# Patient Record
Sex: Female | Born: 1975 | Race: White | Hispanic: No | Marital: Married
Health system: Southern US, Community
[De-identification: ages and names within clinical notes are randomized; demographics above are authoritative.]

---

## 1998-09-17 ENCOUNTER — Inpatient Hospital Stay (HOSPITAL_COMMUNITY): Admission: AD | Admit: 1998-09-17 | Discharge: 1998-09-17 | Payer: Self-pay | Admitting: Obstetrics and Gynecology

## 1998-09-21 ENCOUNTER — Inpatient Hospital Stay (HOSPITAL_COMMUNITY): Admission: AD | Admit: 1998-09-21 | Discharge: 1998-09-23 | Payer: Self-pay | Admitting: Obstetrics and Gynecology

## 1998-09-28 ENCOUNTER — Encounter (HOSPITAL_COMMUNITY): Admission: RE | Admit: 1998-09-28 | Discharge: 1998-12-27 | Payer: Self-pay | Admitting: Obstetrics and Gynecology

## 1998-11-01 ENCOUNTER — Other Ambulatory Visit: Admission: RE | Admit: 1998-11-01 | Discharge: 1998-11-01 | Payer: Self-pay | Admitting: Obstetrics and Gynecology

## 2000-02-03 ENCOUNTER — Inpatient Hospital Stay (HOSPITAL_COMMUNITY): Admission: AD | Admit: 2000-02-03 | Discharge: 2000-02-03 | Payer: Self-pay | Admitting: Obstetrics and Gynecology

## 2000-02-13 ENCOUNTER — Ambulatory Visit (HOSPITAL_COMMUNITY): Admission: RE | Admit: 2000-02-13 | Discharge: 2000-02-13 | Payer: Self-pay | Admitting: Obstetrics and Gynecology

## 2000-02-13 ENCOUNTER — Encounter: Payer: Self-pay | Admitting: Obstetrics and Gynecology

## 2000-02-19 ENCOUNTER — Inpatient Hospital Stay (HOSPITAL_COMMUNITY): Admission: AD | Admit: 2000-02-19 | Discharge: 2000-02-21 | Payer: Self-pay | Admitting: *Deleted

## 2000-04-05 ENCOUNTER — Other Ambulatory Visit: Admission: RE | Admit: 2000-04-05 | Discharge: 2000-04-05 | Payer: Self-pay | Admitting: Obstetrics and Gynecology

## 2000-10-31 ENCOUNTER — Other Ambulatory Visit: Admission: RE | Admit: 2000-10-31 | Discharge: 2000-10-31 | Payer: Self-pay | Admitting: Obstetrics and Gynecology

## 2003-11-07 ENCOUNTER — Ambulatory Visit: Payer: Self-pay | Admitting: Internal Medicine

## 2004-01-07 ENCOUNTER — Ambulatory Visit: Payer: Self-pay | Admitting: Internal Medicine

## 2004-04-28 ENCOUNTER — Ambulatory Visit: Payer: Self-pay | Admitting: Gastroenterology

## 2008-10-26 ENCOUNTER — Ambulatory Visit: Payer: Self-pay | Admitting: Obstetrics and Gynecology

## 2008-11-05 ENCOUNTER — Ambulatory Visit: Payer: Self-pay | Admitting: Obstetrics and Gynecology

## 2008-12-13 ENCOUNTER — Emergency Department: Payer: Self-pay | Admitting: Emergency Medicine

## 2013-04-08 ENCOUNTER — Ambulatory Visit: Payer: Self-pay | Admitting: General Practice

## 2013-06-20 DIAGNOSIS — D239 Other benign neoplasm of skin, unspecified: Secondary | ICD-10-CM

## 2013-06-20 HISTORY — DX: Other benign neoplasm of skin, unspecified: D23.9

## 2013-12-22 ENCOUNTER — Ambulatory Visit: Payer: Self-pay | Admitting: Family Medicine

## 2014-01-06 ENCOUNTER — Ambulatory Visit: Payer: Self-pay | Admitting: Family Medicine

## 2014-11-26 DIAGNOSIS — R634 Abnormal weight loss: Secondary | ICD-10-CM | POA: Insufficient documentation

## 2015-09-14 ENCOUNTER — Other Ambulatory Visit: Payer: Self-pay | Admitting: Family Medicine

## 2015-09-14 DIAGNOSIS — Z1231 Encounter for screening mammogram for malignant neoplasm of breast: Secondary | ICD-10-CM

## 2015-09-27 ENCOUNTER — Other Ambulatory Visit: Payer: Self-pay | Admitting: Family Medicine

## 2015-09-27 ENCOUNTER — Ambulatory Visit
Admission: RE | Admit: 2015-09-27 | Discharge: 2015-09-27 | Disposition: A | Discharge status: Home / Self Care | Payer: 59 | Source: Ambulatory Visit | Attending: Family Medicine | Admitting: Family Medicine

## 2015-09-27 DIAGNOSIS — Z1231 Encounter for screening mammogram for malignant neoplasm of breast: Secondary | ICD-10-CM | POA: Diagnosis present

## 2016-09-18 ENCOUNTER — Other Ambulatory Visit: Payer: Self-pay | Admitting: Family Medicine

## 2016-09-18 DIAGNOSIS — Z1231 Encounter for screening mammogram for malignant neoplasm of breast: Secondary | ICD-10-CM

## 2016-09-29 ENCOUNTER — Ambulatory Visit
Admission: RE | Admit: 2016-09-29 | Discharge: 2016-09-29 | Disposition: A | Discharge status: Home / Self Care | Payer: 59 | Source: Ambulatory Visit | Attending: Family Medicine | Admitting: Family Medicine

## 2016-09-29 DIAGNOSIS — Z1231 Encounter for screening mammogram for malignant neoplasm of breast: Secondary | ICD-10-CM | POA: Diagnosis present

## 2016-10-31 ENCOUNTER — Other Ambulatory Visit: Payer: Self-pay | Admitting: Family Medicine

## 2016-10-31 DIAGNOSIS — N6320 Unspecified lump in the left breast, unspecified quadrant: Secondary | ICD-10-CM

## 2016-11-01 ENCOUNTER — Other Ambulatory Visit: Payer: Self-pay | Admitting: Family Medicine

## 2016-11-01 DIAGNOSIS — N6320 Unspecified lump in the left breast, unspecified quadrant: Secondary | ICD-10-CM

## 2016-11-15 ENCOUNTER — Ambulatory Visit
Admission: RE | Admit: 2016-11-15 | Discharge: 2016-11-15 | Disposition: A | Discharge status: Home / Self Care | Payer: 59 | Source: Ambulatory Visit | Attending: Family Medicine | Admitting: Family Medicine

## 2016-11-15 DIAGNOSIS — N6320 Unspecified lump in the left breast, unspecified quadrant: Secondary | ICD-10-CM | POA: Diagnosis present

## 2016-11-15 DIAGNOSIS — N6002 Solitary cyst of left breast: Secondary | ICD-10-CM | POA: Diagnosis not present

## 2017-10-03 ENCOUNTER — Other Ambulatory Visit: Payer: Self-pay | Admitting: Family Medicine

## 2017-10-03 DIAGNOSIS — Z1231 Encounter for screening mammogram for malignant neoplasm of breast: Secondary | ICD-10-CM

## 2017-10-14 IMAGING — US US BREAST*L* LIMITED INC AXILLA
1 series · 6 of 6 positions shown · non-contrast
Comparison: Previous exam(s).

CLINICAL DATA: Patient complains a palpable mass in the left
breast.

EXAM:
2D DIGITAL DIAGNOSTIC LEFT MAMMOGRAM WITH CAD AND ADJUNCT TOMO
ULTRASOUND LEFT BREAST

[Series 1: us breast*left* limited inc axilla · 0.02mm/px · 6 of 6 slices shown]
[im 1/6]
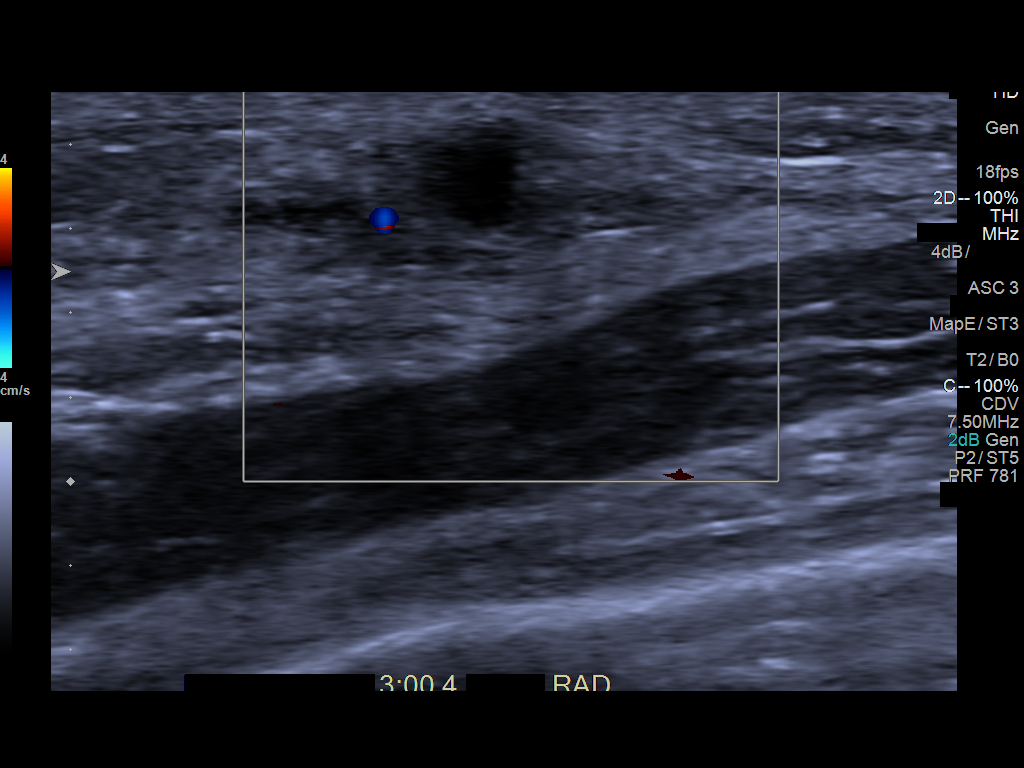
[im 2/6]
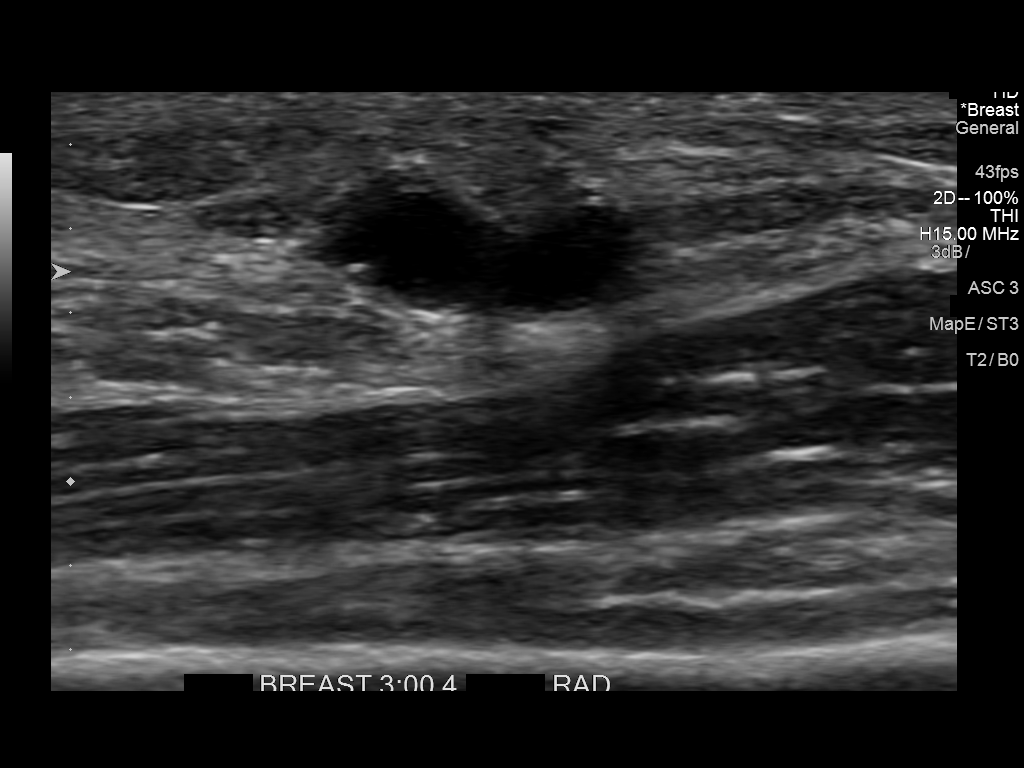
[im 3/6]
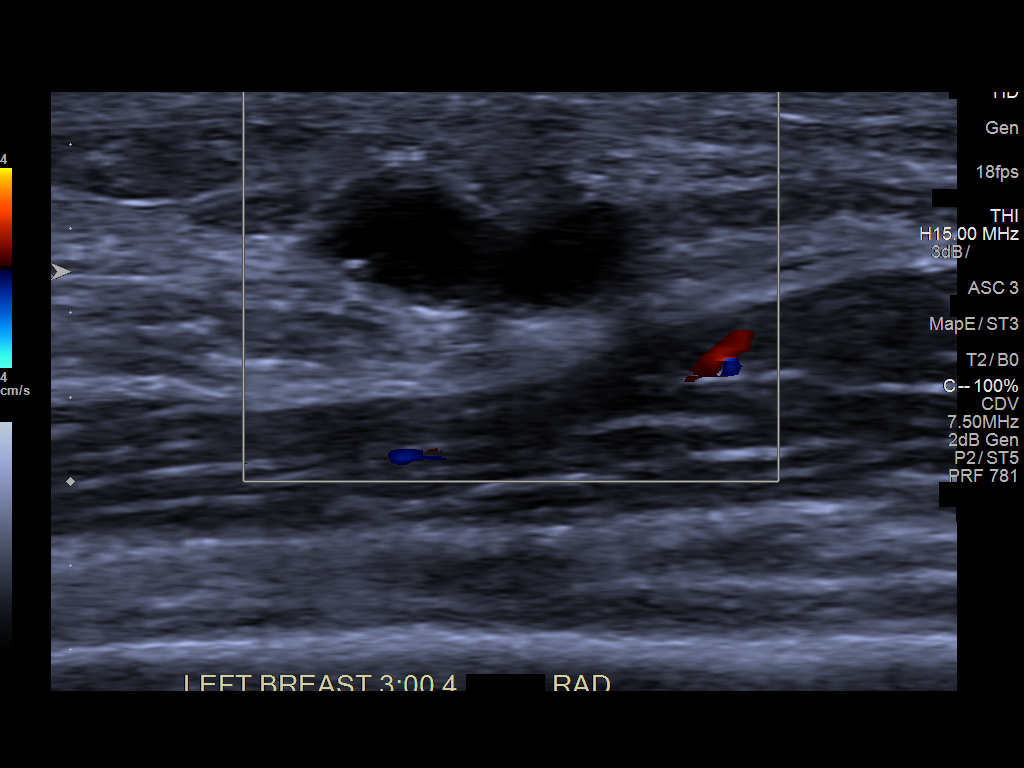
[im 4/6]
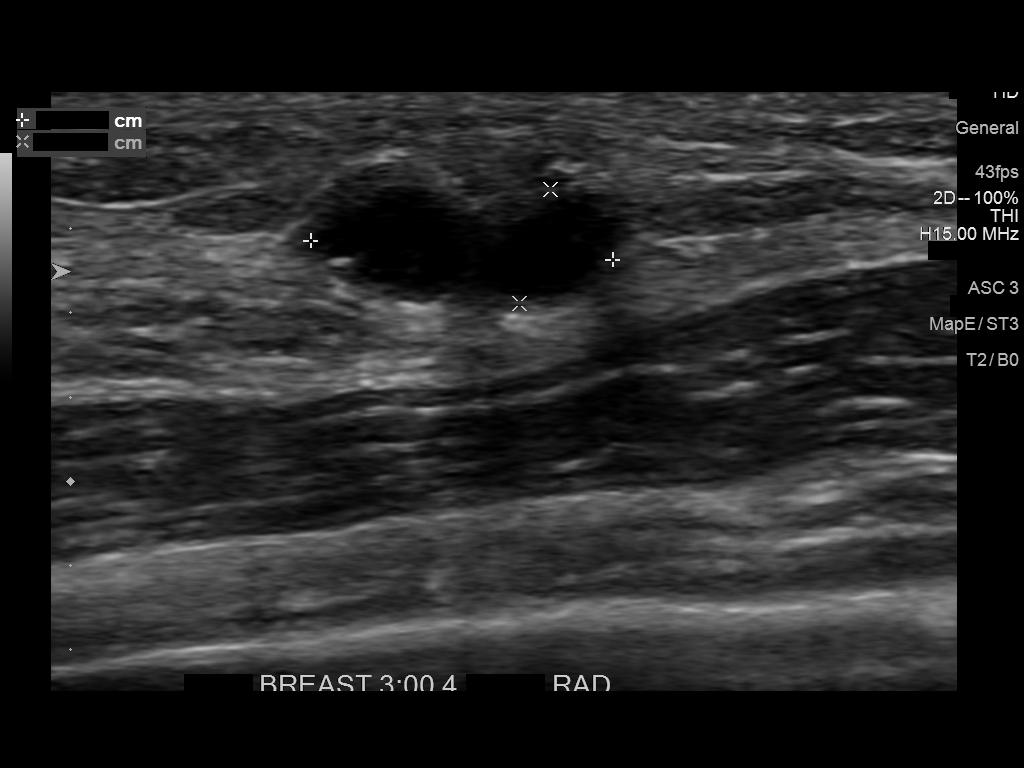
[im 5/6]
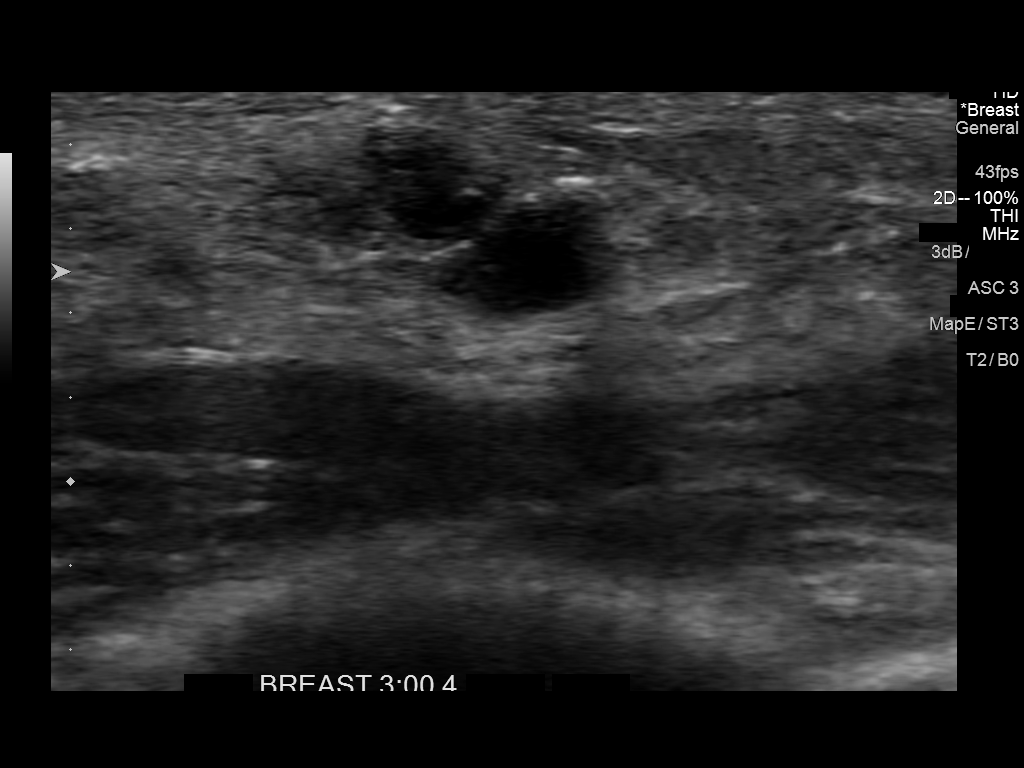
[im 6/6]
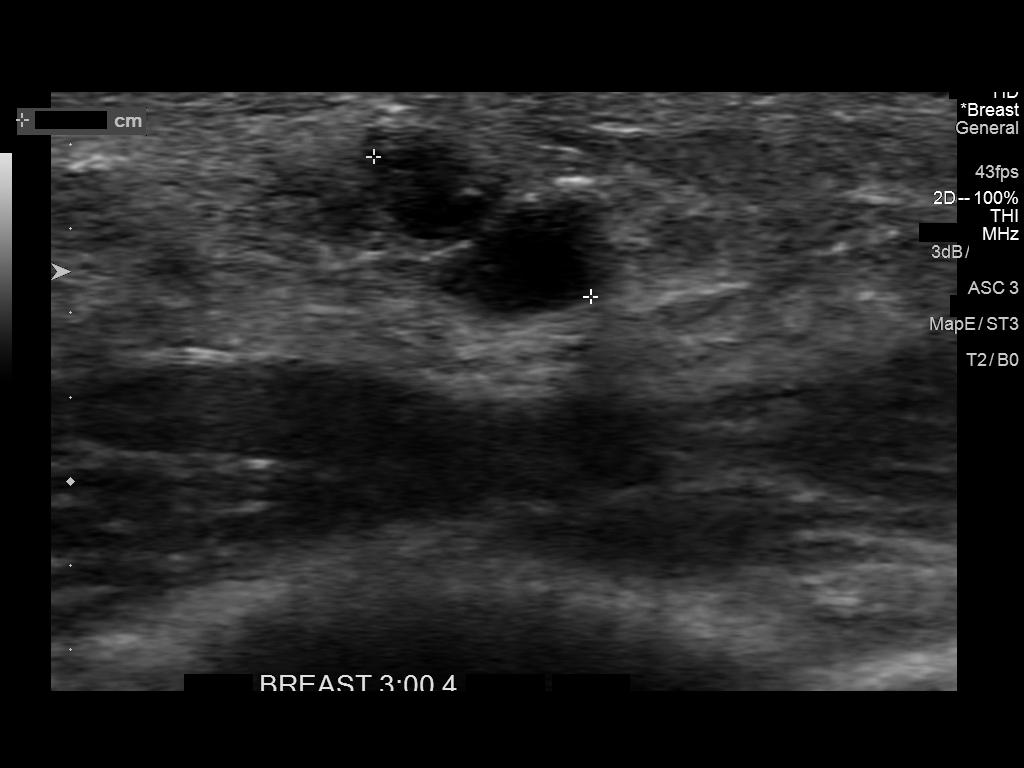

[6 of 6 positions shown; findings below may reference images not displayed]

ACR Breast Density Category c: The breast tissue is heterogeneously
dense, which may obscure small masses.
FINDINGS: Radiopaque BB marks the palpable abnormality in the 3 o'clock region
of the left breast. On the spot tangential view there is an obscured
9 mm mass. No additional masses are seen in the left breast. There
are no malignant type microcalcifications.

Mammographic images were processed with CAD.

On physical exam, I palpate a discrete mass in the left breast at 3
o'clock 4 cm from the nipple.

Targeted ultrasound is performed, showing an anechoic bilobed cyst
in the left breast at 3 o'clock 4 cm from the nipple measuring 7 x 3
x 6 mm. No solid mass or abnormal shadowing detected.
IMPRESSION: Left breast cyst.  No evidence of malignancy.

RECOMMENDATION:
Bilateral screening mammogram in Wednesday September, 2017 is recommended.

I have discussed the findings and recommendations with the patient.
Results were also provided in writing at the conclusion of the
visit. If applicable, a reminder letter will be sent to the patient
regarding the next appointment.

BI-RADS CATEGORY  2: Benign.

## 2017-10-26 ENCOUNTER — Ambulatory Visit
Admission: RE | Admit: 2017-10-26 | Discharge: 2017-10-26 | Disposition: A | Discharge status: Home / Self Care | Payer: Managed Care, Other (non HMO) | Source: Ambulatory Visit | Attending: Family Medicine | Admitting: Family Medicine

## 2017-10-26 DIAGNOSIS — Z1231 Encounter for screening mammogram for malignant neoplasm of breast: Secondary | ICD-10-CM | POA: Diagnosis present

## 2017-10-30 ENCOUNTER — Other Ambulatory Visit: Payer: Self-pay | Admitting: Family Medicine

## 2017-10-30 DIAGNOSIS — N6489 Other specified disorders of breast: Secondary | ICD-10-CM

## 2017-10-30 DIAGNOSIS — N6320 Unspecified lump in the left breast, unspecified quadrant: Secondary | ICD-10-CM

## 2017-10-30 DIAGNOSIS — R928 Other abnormal and inconclusive findings on diagnostic imaging of breast: Secondary | ICD-10-CM

## 2017-11-12 ENCOUNTER — Ambulatory Visit
Admission: RE | Admit: 2017-11-12 | Discharge: 2017-11-12 | Disposition: A | Discharge status: Home / Self Care | Payer: Managed Care, Other (non HMO) | Source: Ambulatory Visit | Attending: Family Medicine | Admitting: Family Medicine

## 2017-11-12 DIAGNOSIS — N6320 Unspecified lump in the left breast, unspecified quadrant: Secondary | ICD-10-CM

## 2017-11-12 DIAGNOSIS — N6489 Other specified disorders of breast: Secondary | ICD-10-CM | POA: Insufficient documentation

## 2017-11-12 DIAGNOSIS — R928 Other abnormal and inconclusive findings on diagnostic imaging of breast: Secondary | ICD-10-CM

## 2020-12-20 ENCOUNTER — Other Ambulatory Visit: Payer: Self-pay | Admitting: Family Medicine

## 2020-12-20 DIAGNOSIS — Z1231 Encounter for screening mammogram for malignant neoplasm of breast: Secondary | ICD-10-CM

## 2021-03-07 ENCOUNTER — Ambulatory Visit
Admission: RE | Admit: 2021-03-07 | Discharge: 2021-03-07 | Disposition: A | Discharge status: Home / Self Care | Payer: Managed Care, Other (non HMO) | Source: Ambulatory Visit | Attending: Family Medicine | Admitting: Family Medicine

## 2021-03-07 ENCOUNTER — Other Ambulatory Visit: Payer: Self-pay

## 2021-03-07 DIAGNOSIS — Z1231 Encounter for screening mammogram for malignant neoplasm of breast: Secondary | ICD-10-CM | POA: Insufficient documentation

## 2021-04-11 ENCOUNTER — Other Ambulatory Visit: Payer: Self-pay

## 2021-04-11 ENCOUNTER — Ambulatory Visit (INDEPENDENT_AMBULATORY_CARE_PROVIDER_SITE_OTHER): Payer: Managed Care, Other (non HMO) | Admitting: Dermatology

## 2021-04-11 ENCOUNTER — Encounter: Payer: Self-pay | Admitting: Dermatology

## 2021-04-11 DIAGNOSIS — D485 Neoplasm of uncertain behavior of skin: Secondary | ICD-10-CM

## 2021-04-11 DIAGNOSIS — Z86018 Personal history of other benign neoplasm: Secondary | ICD-10-CM

## 2021-04-11 DIAGNOSIS — L814 Other melanin hyperpigmentation: Secondary | ICD-10-CM

## 2021-04-11 DIAGNOSIS — Z1283 Encounter for screening for malignant neoplasm of skin: Secondary | ICD-10-CM

## 2021-04-11 DIAGNOSIS — D225 Melanocytic nevi of trunk: Secondary | ICD-10-CM

## 2021-04-11 DIAGNOSIS — D229 Melanocytic nevi, unspecified: Secondary | ICD-10-CM

## 2021-04-11 DIAGNOSIS — L821 Other seborrheic keratosis: Secondary | ICD-10-CM

## 2021-04-11 DIAGNOSIS — D1800 Hemangioma unspecified site: Secondary | ICD-10-CM

## 2021-04-11 DIAGNOSIS — D2271 Melanocytic nevi of right lower limb, including hip: Secondary | ICD-10-CM

## 2021-04-11 DIAGNOSIS — L578 Other skin changes due to chronic exposure to nonionizing radiation: Secondary | ICD-10-CM

## 2021-04-11 NOTE — Progress Notes (Signed)
? ?New Patient Visit ? ?Subjective  ?Lynn Krueger is a 46 y.o. female who presents for the following: Annual Exam (History of dysplastic nevus - TBSE today). ?The patient presents for Total-Body Skin Exam (TBSE) for skin cancer screening and mole check.  The patient has spots, moles and lesions to be evaluated, some may be new or changing and the patient has concerns that these could be cancer. ? ?The following portions of the chart were reviewed this encounter and updated as appropriate:  ? Allergies  Meds  Problems  Med Hx  Surg Hx  Fam Hx   ?  ?Review of Systems:  No other skin or systemic complaints except as noted in HPI or Assessment and Plan. ? ?Objective  ?Well appearing patient in no apparent distress; mood and affect are within normal limits. ? ?A full examination was performed including scalp, head, eyes, ears, nose, lips, neck, chest, axillae, abdomen, back, buttocks, bilateral upper extremities, bilateral lower extremities, hands, feet, fingers, toes, fingernails, and toenails. All findings within normal limits unless otherwise noted below. ? ?Right medial calf ?Well healed biopsy site ? ?Right UQA lat at the side ?0.5 x 0.7 cm brown papule ? ? ? ? ? ? ? ?Assessment & Plan  ? ?Lentigines ?- Scattered tan macules ?- Due to sun exposure ?- Benign-appearing, observe ?- Recommend daily broad spectrum sunscreen SPF 30+ to sun-exposed areas, reapply every 2 hours as needed. ?- Call for any changes ? ?Seborrheic Keratoses ?- Stuck-on, waxy, tan-brown papules and/or plaques  ?- Benign-appearing ?- Discussed benign etiology and prognosis. ?- Observe ?- Call for any changes ? ?Melanocytic Nevi ?- Tan-brown and/or pink-flesh-colored symmetric macules and papules ?- Benign appearing on exam today ?- Observation ?- Call clinic for new or changing moles ?- Recommend daily use of broad spectrum spf 30+ sunscreen to sun-exposed areas.  ? ?Hemangiomas ?- Red papules ?- Discussed benign nature ?- Observe ?-  Call for any changes ? ?Actinic Damage ?- Chronic condition, secondary to cumulative UV/sun exposure ?- diffuse scaly erythematous macules with underlying dyspigmentation ?- Recommend daily broad spectrum sunscreen SPF 30+ to sun-exposed areas, reapply every 2 hours as needed.  ?- Staying in the shade or wearing long sleeves, sun glasses (UVA+UVB protection) and wide brim hats (4-inch brim around the entire circumference of the hat) are also recommended for sun protection.  ?- Call for new or changing lesions. ? ?Skin cancer screening performed today. ? ?History of Dysplastic Nevi ?- No evidence of recurrence today ?- Recommend regular full body skin exams ?- Recommend daily broad spectrum sunscreen SPF 30+ to sun-exposed areas, reapply every 2 hours as needed.  ?- Call if any new or changing lesions are noted between office visits ? 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 ?Neoplasm of uncertain behavior of skin ?Right UQA lat at the side ?Epidermal / dermal shaving ? ?Lesion diameter (cm):  0.5 x 0.7 cm ?Informed consent: discussed and consent obtained   ?Timeout: patient name, date of birth, surgical site, and procedure verified   ?Procedure prep:  Patient was prepped and draped in usual sterile fashion ?Prep type:  Isopropyl alcohol ?Anesthesia: the lesion was anesthetized in a standard fashion   ?Anesthetic:  1% lidocaine w/ epinephrine 1-100,000 buffered w/ 8.4% NaHCO3 ?Instrument used: flexible razor blade   ?Hemostasis achieved with: pressure, aluminum chloride and electrodesiccation   ?Outcome: patient tolerated procedure well   ?Post-procedure details:  sterile dressing applied and wound care instructions given   ?Dressing type: bandage and petrolatum   ? ?Related Procedures ?Anatomic Pathology Report ? ?Skin cancer screening ? ?Return in about 1 year (around 04/12/2022) for TBSE. ? ?I, Ashok Cordia, CMA, am acting as scribe for  Sarina Ser, MD . ?Documentation: I have reviewed the above documentation for accuracy and completeness, and I agree with the above. ? ?Sarina Ser, MD ? ?

## 2021-04-11 NOTE — Patient Instructions (Signed)

## 2021-04-12 ENCOUNTER — Encounter: Payer: Self-pay | Admitting: Dermatology

## 2021-04-14 ENCOUNTER — Telehealth: Payer: Self-pay

## 2021-04-14 LAB — ANATOMIC PATHOLOGY REPORT

## 2021-04-14 NOTE — Telephone Encounter (Signed)
-----   Message from Ralene Bathe, MD sent at 04/14/2021 12:43 PM EST ----- ?Diagnosis synopsis: Comment  ?Comment: Specimen 1-Skin Biopsy, Right Upper Quadrant Lateral At The  ?Side: COMPOUND MELANOCYTIC NEVUS ? ?Benign mole ?No further treatment needed ?

## 2021-04-14 NOTE — Telephone Encounter (Signed)
Unable to leave a message. No V/M set up.  ?

## 2021-04-21 ENCOUNTER — Telehealth: Payer: Self-pay

## 2021-04-21 NOTE — Telephone Encounter (Signed)
Patient informed of pathology results via [facility].  ?

## 2021-04-21 NOTE — Telephone Encounter (Signed)
-----   Message from Ralene Bathe, MD sent at 04/14/2021 12:43 PM EST ----- ?Diagnosis synopsis: Comment  ?Comment: Specimen 1-Skin Biopsy, Right Upper Quadrant Lateral At The  ?Side: COMPOUND MELANOCYTIC NEVUS ? ?Benign mole ?No further treatment needed ?

## 2022-02-10 ENCOUNTER — Other Ambulatory Visit: Payer: Self-pay | Admitting: Internal Medicine

## 2022-02-10 DIAGNOSIS — Z1231 Encounter for screening mammogram for malignant neoplasm of breast: Secondary | ICD-10-CM

## 2022-03-13 ENCOUNTER — Ambulatory Visit
Admission: RE | Admit: 2022-03-13 | Discharge: 2022-03-13 | Disposition: A | Discharge status: Home / Self Care | Payer: Managed Care, Other (non HMO) | Source: Ambulatory Visit | Attending: Internal Medicine | Admitting: Internal Medicine

## 2022-03-13 DIAGNOSIS — Z1231 Encounter for screening mammogram for malignant neoplasm of breast: Secondary | ICD-10-CM | POA: Diagnosis present

## 2022-03-16 ENCOUNTER — Other Ambulatory Visit: Payer: Self-pay | Admitting: Internal Medicine

## 2022-03-16 DIAGNOSIS — R928 Other abnormal and inconclusive findings on diagnostic imaging of breast: Secondary | ICD-10-CM

## 2022-03-16 DIAGNOSIS — N63 Unspecified lump in unspecified breast: Secondary | ICD-10-CM

## 2022-03-21 ENCOUNTER — Ambulatory Visit
Admission: RE | Admit: 2022-03-21 | Discharge: 2022-03-21 | Disposition: A | Discharge status: Home / Self Care | Payer: Managed Care, Other (non HMO) | Source: Ambulatory Visit | Attending: Internal Medicine | Admitting: Internal Medicine

## 2022-03-21 DIAGNOSIS — R928 Other abnormal and inconclusive findings on diagnostic imaging of breast: Secondary | ICD-10-CM | POA: Diagnosis present

## 2022-03-21 DIAGNOSIS — N63 Unspecified lump in unspecified breast: Secondary | ICD-10-CM

## 2022-04-17 ENCOUNTER — Encounter: Payer: Self-pay | Admitting: Dermatology

## 2022-04-17 ENCOUNTER — Ambulatory Visit: Payer: Managed Care, Other (non HMO) | Admitting: Dermatology

## 2022-04-17 VITALS — BP 92/61

## 2022-04-17 DIAGNOSIS — D225 Melanocytic nevi of trunk: Secondary | ICD-10-CM

## 2022-04-17 DIAGNOSIS — L821 Other seborrheic keratosis: Secondary | ICD-10-CM

## 2022-04-17 DIAGNOSIS — Z1283 Encounter for screening for malignant neoplasm of skin: Secondary | ICD-10-CM

## 2022-04-17 DIAGNOSIS — L814 Other melanin hyperpigmentation: Secondary | ICD-10-CM

## 2022-04-17 DIAGNOSIS — Z86018 Personal history of other benign neoplasm: Secondary | ICD-10-CM | POA: Diagnosis not present

## 2022-04-17 DIAGNOSIS — L578 Other skin changes due to chronic exposure to nonionizing radiation: Secondary | ICD-10-CM

## 2022-04-17 DIAGNOSIS — D1801 Hemangioma of skin and subcutaneous tissue: Secondary | ICD-10-CM

## 2022-04-17 DIAGNOSIS — D229 Melanocytic nevi, unspecified: Secondary | ICD-10-CM

## 2022-04-17 NOTE — Progress Notes (Signed)
   Follow-Up Visit   Subjective  Lynn Krueger is a 47 y.o. female who presents for the following: Annual Exam (History of dysplastic nevi - The patient presents for Total-Body Skin Exam (TBSE) for skin cancer screening and mole check.  The patient has spots, moles and lesions to be evaluated, some may be new or changing and the patient has concerns that these could be cancer./).  The following portions of the chart were reviewed this encounter and updated as appropriate:   Allergies  Meds  Problems  Med Hx  Surg Hx  Fam Hx     Review of Systems:  No other skin or systemic complaints except as noted in HPI or Assessment and Plan.  Objective  Well appearing patient in no apparent distress; mood and affect are within normal limits.  A full examination was performed including scalp, head, eyes, ears, nose, lips, neck, chest, axillae, abdomen, back, buttocks, bilateral upper extremities, bilateral lower extremities, hands, feet, fingers, toes, fingernails, and toenails. All findings within normal limits unless otherwise noted below.  Right UQA lat, ears, Left axilla Well healed biopsy site with repigmentation of right UQA lat. Brown macules of bilat ears. 0.3 cm regular brown macule of left axilla.      Assessment & Plan   History of Dysplastic Nevi - No evidence of recurrence today - Recommend regular full body skin exams - Recommend daily broad spectrum sunscreen SPF 30+ to sun-exposed areas, reapply every 2 hours as needed.  - Call if any new or changing lesions are noted between office visits  Lentigines - Scattered tan macules - Due to sun exposure - Benign-appearing, observe - Recommend daily broad spectrum sunscreen SPF 30+ to sun-exposed areas, reapply every 2 hours as needed. - Call for any changes  Seborrheic Keratoses - Stuck-on, waxy, tan-brown papules and/or plaques  - Benign-appearing - Discussed benign etiology and prognosis. - Observe - Call for any  changes  Melanocytic Nevi - Tan-brown and/or pink-flesh-colored symmetric macules and papules - Benign appearing on exam today - Observation - Call clinic for new or changing moles - Recommend daily use of broad spectrum spf 30+ sunscreen to sun-exposed areas.   Hemangiomas - Red papules - Discussed benign nature - Observe - Call for any changes  Actinic Damage - Chronic condition, secondary to cumulative UV/sun exposure - diffuse scaly erythematous macules with underlying dyspigmentation - Recommend daily broad spectrum sunscreen SPF 30+ to sun-exposed areas, reapply every 2 hours as needed.  - Staying in the shade or wearing long sleeves, sun glasses (UVA+UVB protection) and wide brim hats (4-inch brim around the entire circumference of the hat) are also recommended for sun protection.  - Call for new or changing lesions.  Skin cancer screening performed today.  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  Left axilla, ears See photos of Left Axilla Benign-appearing.  Observation.  Call clinic for new or changing lesions.  Recommend daily use of broad spectrum spf 30+ sunscreen to sun-exposed areas.   Return in about 1 year (around 04/17/2023) for TBSE.  I, Ashok Cordia, CMA, am acting as scribe for Sarina Ser, MD . Documentation: I have reviewed the above documentation for accuracy and completeness, and I agree with the above.  Sarina Ser, MD

## 2022-04-17 NOTE — Patient Instructions (Signed)
Due to recent changes in healthcare laws, you may see results of your pathology and/or laboratory studies on MyChart before the doctors have had a chance to review them. We understand that in some cases there may be results that are confusing or concerning to you. Please understand that not all results are received at the same time and often the doctors may need to interpret multiple results in order to provide you with the best plan of care or course of treatment. Therefore, we ask that you please give us 2 business days to thoroughly review all your results before contacting the office for clarification. Should we see a critical lab result, you will be contacted sooner.   If You Need Anything After Your Visit  If you have any questions or concerns for your doctor, please call our main line at [phone] and press option 4 to reach your doctor's medical assistant. If no one answers, please leave a voicemail as directed and we will return your call as soon as possible. Messages left after 4 pm will be answered the following business day.   You may also send us a message via MyChart. We typically respond to MyChart messages within 1-2 business days.  For prescription refills, please ask your pharmacy to contact our office. Our fax number is [phone].  If you have an urgent issue when the clinic is closed that cannot wait until the next business day, you can page your doctor at the number below.    Please note that while we do our best to be available for urgent issues outside of office hours, we are not available 24/7.   If you have an urgent issue and are unable to reach us, you may choose to seek medical care at your doctor's office, retail clinic, urgent care center, or emergency room.  If you have a medical emergency, please immediately call 911 or go to the emergency department.  Pager Numbers  - Dr. Kowalski: [phone]  - Dr. Moye: [phone]  - Dr. Stewart:  [phone]  In the event of inclement weather, please call our main line at [phone] for an update on the status of any delays or closures.  Dermatology Medication Tips: Please keep the boxes that topical medications come in in order to help keep track of the instructions about where and how to use these. Pharmacies typically print the medication instructions only on the boxes and not directly on the medication tubes.   If your medication is too expensive, please contact our office at [phone] option 4 or send us a message through MyChart.   We are unable to tell what your co-pay for medications will be in advance as this is different depending on your insurance coverage. However, we may be able to find a substitute medication at lower cost or fill out paperwork to get insurance to cover a needed medication.   If a prior authorization is required to get your medication covered by your insurance company, please allow us 1-2 business days to complete this process.  Drug prices often vary depending on where the prescription is filled and some pharmacies may offer cheaper prices.  The website www.goodrx.com contains coupons for medications through different pharmacies. The prices here do not account for what the cost may be with help from insurance (it may be cheaper with your insurance), but the website can give you the price if you did not use any insurance.  - You can print the associated coupon and take it with   your prescription to the pharmacy.  - You may also stop by our office during regular business hours and pick up a GoodRx coupon card.  - If you need your prescription sent electronically to a different pharmacy, notify our office through [place] MyChart or by phone at [phone] option 4.     Si Usted Necesita Algo Despus de Su Visita  Tambin puede enviarnos un mensaje a travs de MyChart. Por lo general respondemos a los mensajes de MyChart en el transcurso de 1 a 2  das hbiles.  Para renovar recetas, por favor pida a su farmacia que se ponga en contacto con nuestra oficina. Nuestro nmero de fax es el [phone].  Si tiene un asunto urgente cuando la clnica est cerrada y que no puede esperar hasta el siguiente da hbil, puede llamar/localizar a su doctor(a) al nmero que aparece a continuacin.   Por favor, tenga en cuenta que aunque hacemos todo lo posible para estar disponibles para asuntos urgentes fuera del horario de oficina, no estamos disponibles las 24 horas del da, los 7 das de la semana.   Si tiene un problema urgente y no puede comunicarse con nosotros, puede optar por buscar atencin mdica  en el consultorio de su doctor(a), en una clnica privada, en un centro de atencin urgente o en una sala de emergencias.  Si tiene una emergencia mdica, por favor llame inmediatamente al 911 o vaya a la sala de emergencias.  Nmeros de bper  - Dr. Kowalski: [phone]  - Dra. Moye: [phone]  - Dra. Stewart: [phone]  En caso de inclemencias del tiempo, por favor llame a nuestra lnea principal al [phone] para una actualizacin sobre el estado de cualquier retraso o cierre.  Consejos para la medicacin en dermatologa: Por favor, guarde las cajas en las que vienen los medicamentos de uso tpico para ayudarle a seguir las instrucciones sobre dnde y cmo usarlos. Las farmacias generalmente imprimen las instrucciones del medicamento slo en las cajas y no directamente en los tubos del medicamento.   Si su medicamento es muy caro, por favor, pngase en contacto con nuestra oficina llamando al [phone] y presione la opcin 4 o envenos un mensaje a travs de MyChart.   No podemos decirle cul ser su copago por los medicamentos por adelantado ya que esto es diferente dependiendo de la cobertura de su seguro. Sin embargo, es posible que podamos encontrar un medicamento sustituto a menor costo o llenar un formulario para que el  seguro cubra el medicamento que se considera necesario.   Si se requiere una autorizacin previa para que su compaa de seguros cubra su medicamento, por favor permtanos de 1 a 2 das hbiles para completar este proceso.  Los precios de los medicamentos varan con frecuencia dependiendo del lugar de dnde se surte la receta y alguna farmacias pueden ofrecer precios ms baratos.  El sitio web www.goodrx.com tiene cupones para medicamentos de diferentes farmacias. Los precios aqu no tienen en cuenta lo que podra costar con la ayuda del seguro (puede ser ms barato con su seguro), pero el sitio web puede darle el precio si no utiliz ningn seguro.  - Puede imprimir el cupn correspondiente y llevarlo con su receta a la farmacia.  - Tambin puede pasar por nuestra oficina durante el horario de atencin regular y recoger una tarjeta de cupones de GoodRx.  - Si necesita que su receta se enve electrnicamente a una farmacia diferente, informe a nuestra oficina a travs de MyChart de [place]   o por telfono llamando al [phone] y presione la opcin 4.  

## 2022-08-24 NOTE — Progress Notes (Signed)
[name] [address] [place] [address] Phone: [phone] Subjective:    I'm seeing this patient by the request  of:  Donnalee Curry, MD  CC: Left knee pain  [id number]  Lynn Krueger is a 47 y.o. female coming in with complaint of L knee pain  Onset- intermittent Location - medial aspect, peripatellar. Notes nerve-typepain when touching the patella. Swelling at posterior aspect.  Duration- several years, worse over the past year Character- some weakness,  Aggravating factors- descending stairs, squatting Reliving factors- rest, brace with lateral movements Therapies tried- ice Severity- 1/10 today; 3/10.     Past Medical History:  Diagnosis Date   Dysplastic nevus 06/20/2013   right mid back   Dysplastic nevus 06/20/2013   right lower back   History reviewed. No pertinent surgical history. Social History   Socioeconomic History   Marital status: Married    Spouse name: Not on file   Number of children: Not on file   Years of education: Not on file   Highest education level: Not on file  Occupational History   Not on file  Tobacco Use   Smoking status: Unknown   Smokeless tobacco: Not on file  Substance and Sexual Activity   Alcohol use: Not on file   Drug use: Not on file   Sexual activity: Not on file  Other Topics Concern   Not on file  Social History Narrative   Not on file   Social Determinants of Health   Financial Resource Strain: Low Risk  (02/10/2022)   [facility] from [facility], [facility]   Overall Financial Resource Strain (CARDIA)    Difficulty of Paying Living Expenses: Not hard at all  Food Insecurity: No Food Insecurity (02/10/2022)   Received [facility], [facility]   Hunger Vital Sign    Worried About Running Out of Food in the Last Year: Never true    Ran Out of Food in the Last Year: Never true   Transportation Needs: No Transportation Needs (02/10/2022)   Received from [facility], [facility]   [facility] - Transportation    In the past 12 months, has lack of transportation kept you from medical appointments or from getting medications?: No    Lack of Transportation (Non-Medical): No  Physical Activity: Not on file  Stress: Not on file  Social Connections: Not on file   Allergies  Allergen Reactions   Azithromycin Other (See Comments)   Fluconazole Other (See Comments)    Other Reaction: GI   Barley Grass Other (See Comments)    Other Reaction: GI Upset   Glatiramer Other (See Comments)    Really bad post injection reaction.   Interferon Beta-1a Hives   Lactose Diarrhea   Other Other (See Comments)    Uncoded Allergy. Allergen: rye, Other Reaction: GI Upset   Penicillins Other (See Comments)    Other Reaction: Not Assessed   Tilactase Diarrhea   Family History  Problem Relation Age of Onset   Breast cancer Mother 21       bilateral cancer and double mastectomy         Current Outpatient Medications (Other):    carboxymethylcellulose (REFRESH PLUS) 0.5 % SOLN, Apply to eye.   Dimethyl Fumarate 240 MG CPDR, Take by mouth.   Multiple Vitamin (MULTIVITAMIN) capsule, Take 1 capsule by mouth daily.   Reviewed prior external information including  notes and imaging from  primary care provider As well as notes that were available from care everywhere and other healthcare systems.  Past medical history, social, surgical and family history all reviewed in electronic medical record.  No pertanent information unless stated regarding to the chief complaint.   Review of Systems:  No headache, visual changes, nausea, vomiting, diarrhea, constipation, dizziness, abdominal pain, skin rash, fevers, chills, night sweats, weight loss, swollen lymph nodes, body aches, joint swelling, chest pain, shortness of breath, mood changes. POSITIVE muscle  aches  Objective  Blood pressure 110/64, pulse 86, height 5' 6.5" (1.689 m), weight 104 lb 4.8 oz (47.3 kg), SpO2 99%.   General: No apparent distress alert and oriented x3 mood and affect normal, dressed appropriately.  HEENT: Pupils equal, extraocular movements intact  Respiratory: Patient's speak in full sentences and does not appear short of breath  Cardiovascular: No lower extremity edema, non tender, no erythema  Regular gait noted today. Patient does have atrophy noted of the knees bilaterally.  Patient has atrophy of the VMO bilaterally. Lateral tracking of the patella noted.  Limited muscular skeletal ultrasound was performed and interpreted by Antoine Primas, M  Limited ultrasound shows no significant hypoechoic changes consistent with effusion.  Patient may have some mild abnormality noted of the cartilage underneath the patella.    Impression and Recommendations:     The above documentation has been reviewed and is accurate and complete Judi Saa, DO

## 2022-09-01 ENCOUNTER — Other Ambulatory Visit: Payer: Self-pay

## 2022-09-01 ENCOUNTER — Ambulatory Visit (INDEPENDENT_AMBULATORY_CARE_PROVIDER_SITE_OTHER): Payer: Managed Care, Other (non HMO)

## 2022-09-01 ENCOUNTER — Encounter: Payer: Self-pay | Admitting: Family Medicine

## 2022-09-01 ENCOUNTER — Ambulatory Visit: Payer: Managed Care, Other (non HMO) | Admitting: Family Medicine

## 2022-09-01 VITALS — BP 110/64 | HR 86 | Ht 66.5 in | Wt 104.3 lb

## 2022-09-01 DIAGNOSIS — M25562 Pain in left knee: Secondary | ICD-10-CM

## 2022-09-01 DIAGNOSIS — G35 Multiple sclerosis: Secondary | ICD-10-CM | POA: Insufficient documentation

## 2022-09-01 DIAGNOSIS — G8929 Other chronic pain: Secondary | ICD-10-CM

## 2022-09-01 DIAGNOSIS — M222X2 Patellofemoral disorders, left knee: Secondary | ICD-10-CM | POA: Diagnosis not present

## 2022-09-01 NOTE — Assessment & Plan Note (Signed)
Seems to be more of the left knee.  Discussed icing regimen and home exercises.  Discussed which activities to do and which ones to avoid.  Tru pull lite brace given to help her while she is working out.  I did find the patient does have some atrophy and secondary to patient's other comorbidities this could be contributing.  Discussed with patient about which activities to avoid such as deep squats.  Worsening pain consider formal physical therapy and the possibility of injections.  Follow-up again in 6 weeks

## 2022-09-01 NOTE — Patient Instructions (Addendum)
Good to see you Tru pull lite VMO exerises Xray on way out  Vit C 1000mg  You have 14 days to return or exchange your brace Call [phone], then return the brace to our office     See me in 6-8 weeks

## 2022-10-05 NOTE — Progress Notes (Signed)
[facility] [address] [place] [address] Phone: [phone] Subjective:   [name], am serving as a scribe for Dr. Antoine Primas.  I'm seeing this patient by the request  of:  Donnalee Curry, MD  CC: knee pain   [id number]  09/01/2022 Seems to be more of the left knee. Discussed icing regimen and home exercises. Discussed which activities to do and which ones to avoid. Tru pull lite brace given to help her while she is working out. I did find the patient does have some atrophy and secondary to patient's other comorbidities this could be contributing. Discussed with patient about which activities to avoid such as deep squats. Worsening pain consider formal physical therapy and the possibility of injections. Follow-up again in 6 weeks   Updated 10/13/2022 Lynn Krueger is a 47 y.o. female coming in with complaint of knee pain. Patient has not had much improvement. Developed sharp pain in anterior aspect of her knee when pushing off to her right. Also had some sharp pain when performing hip flexion on the L side. Sharp pain seems to be intermittent with no pattern. Brace does give her more stability. Has been going to PT.        Past Medical History:  Diagnosis Date   Dysplastic nevus 06/20/2013   right mid back   Dysplastic nevus 06/20/2013   right lower back   No past surgical history on file. Social History   Socioeconomic History   Marital status: Married    Spouse name: Not on file   Number of children: Not on file   Years of education: Not on file   Highest education level: Not on file  Occupational History   Not on file  Tobacco Use   Smoking status: Unknown   Smokeless tobacco: Not on file  Substance and Sexual Activity   Alcohol use: Not on file   Drug use: Not on file   Sexual activity: Not on file  Other Topics Concern   Not on file  Social History Narrative   Not on file   Social Determinants of Health    Financial Resource Strain: Low Risk  (02/10/2022)   Received from [facility], [facility]   Overall Financial Resource Strain (CARDIA)    Difficulty of Paying Living Expenses: Not hard at all  Food Insecurity: No Food Insecurity (02/10/2022)   Received from [facility], [facility]   Hunger Vital Sign    Worried About Running Out of Food in the Last Year: Never true    Ran Out of Food in the Last Year: Never true  Transportation Needs: No Transportation Needs (02/10/2022)   Received from [facility], [facility]   [facility] - Transportation    In the past 12 months, has lack of transportation kept you from medical appointments or from getting medications?: No    Lack of Transportation (Non-Medical): No  Physical Activity: Not on file  Stress: Not on file  Social Connections: Not on file   Allergies  Allergen Reactions   Azithromycin Other (See Comments)   Fluconazole Other (See Comments)    Other Reaction: GI   Barley Grass Other (See Comments)    Other Reaction: GI Upset   Glatiramer Other (See Comments)    Really bad post injection reaction.   Interferon Beta-1a Hives   Lactose Diarrhea   Other Other (See Comments)  Uncoded Allergy. Allergen: rye, Other Reaction: GI Upset   Penicillins Other (See Comments)    Other Reaction: Not Assessed   Tilactase Diarrhea   Family History  Problem Relation Age of Onset   Breast cancer Mother 2       bilateral cancer and double mastectomy         Current Outpatient Medications (Other):    carboxymethylcellulose (REFRESH PLUS) 0.5 % SOLN, Apply to eye.   Dimethyl Fumarate 240 MG CPDR, Take by mouth.   Multiple Vitamin (MULTIVITAMIN) capsule, Take 1 capsule by mouth daily.   Reviewed prior external information including notes and imaging from  primary care provider As well as notes that were available from care everywhere  and other healthcare systems.  Past medical history, social, surgical and family history all reviewed in electronic medical record.  No pertanent information unless stated regarding to the chief complaint.   Review of Systems:  No headache, visual changes, nausea, vomiting, diarrhea, constipation, dizziness, abdominal pain, skin rash, fevers, chills, night sweats, weight loss, swollen lymph nodes, body aches, joint swelling, chest pain, shortness of breath, mood changes. POSITIVE muscle aches  Objective  Blood pressure 104/62, pulse 83, height 5' 6.5" (1.689 m), weight 107 lb (48.5 kg), SpO2 99%.   General: No apparent distress alert and oriented x3 mood and affect normal, dressed appropriately.  Patient is underweight HEENT: Pupils equal, extraocular movements intact  Respiratory: Patient's speak in full sentences and does not appear short of breath  Cardiovascular: No lower extremity edema, non tender, no erythema  Knee exam shows lateral tracking of the patella noted.  No crepitus noted.  Patient still has atrophy of the VMO but is symmetric to the contralateral side.  Neurovascular intact distally.      Impression and Recommendations:    The above documentation has been reviewed and is accurate and complete Judi Saa, DO

## 2022-10-13 ENCOUNTER — Ambulatory Visit: Payer: Managed Care, Other (non HMO) | Admitting: Family Medicine

## 2022-10-13 ENCOUNTER — Encounter: Payer: Self-pay | Admitting: Family Medicine

## 2022-10-13 VITALS — BP 104/62 | HR 83 | Ht 66.5 in | Wt 107.0 lb

## 2022-10-13 DIAGNOSIS — M222X2 Patellofemoral disorders, left knee: Secondary | ICD-10-CM | POA: Diagnosis not present

## 2022-10-13 NOTE — Patient Instructions (Signed)
[email or web address] Cut out knee brace Keep doing exercises Lower impact is better Visco and PRP handouts See me in 10-12 weeks

## 2022-10-13 NOTE — Assessment & Plan Note (Signed)
Patient does not feel like she has made any significant improvement at this time.  Has been wearing the brace occasionally.  Has started the vitamin supplementations.  Discussed different things such as formal physical therapy or potential injections which patient declined.  Discussed other potential injections including viscosupplementation as well as PRP.  Discussed the possibility of certain imaging if needed.  Follow-up with me again in 2 to 3 months.

## 2022-12-13 ENCOUNTER — Ambulatory Visit: Payer: Managed Care, Other (non HMO) | Admitting: Obstetrics

## 2022-12-13 ENCOUNTER — Encounter: Payer: Self-pay | Admitting: Obstetrics

## 2022-12-13 VITALS — BP 100/70 | HR 81 | Ht 66.0 in | Wt 108.0 lb

## 2022-12-13 DIAGNOSIS — N926 Irregular menstruation, unspecified: Secondary | ICD-10-CM

## 2022-12-13 DIAGNOSIS — N951 Menopausal and female climacteric states: Secondary | ICD-10-CM | POA: Diagnosis not present

## 2022-12-13 DIAGNOSIS — R232 Flushing: Secondary | ICD-10-CM

## 2022-12-13 NOTE — Progress Notes (Signed)
    GYNECOLOGY PROGRESS NOTE  Subjective:  PCP: Donnalee Curry, MD  Patient ID: Lynn Krueger, female    DOB: 1975-03-07, 47 y.o.   MRN: [id number]  HPI  Patient is a 47 y.o. G27P2002 female who presents for irregular periods starting this past spring, wondering if she is in perimenopause or if something else is wrong. Her periods used to be regular monthly, are now about Q34days which is longer for her, her flow has changed and lightened from usual, and length of cycle is sometimes 7-9 days, used to be shorter. She is also experiencing hot flashes at night and less than half the nights during week. She does not sweat, but will feel hot while sleeping and need to pull the covers off. She is typically a "cold person" so this has worried her too. Also reports her moods are "off," not depressed or anxious, but just different. She is not taking any medication or supplement to help with this. Denies PMH of thyroid or endocrine disease, reports has been recently tested and everything was normal. Does annual exams with PCP and due in Jan '25 to go in.   The following portions of the patient's history were reviewed and updated as appropriate: allergies, current medications, past family history, past medical history, past social history, past surgical history, and problem list.  Review of Systems Pertinent items are noted in HPI.   Objective:   Blood pressure 100/70, pulse 81, height 5\' 6"  (1.676 m), weight 108 lb (49 kg), last menstrual period 11/11/2022. Body mass index is 17.43 kg/m.  General appearance: alert and cooperative Respiratory: normal work of breathing Pelvic: deferred Extremities: extremities normal, atraumatic, no cyanosis or edema Neurologic: Grossly normal  Assessment/Plan:   1. Perimenopause   2. Menstrual periods irregular   3. Hot flashes     [name] with several months of period changes and hot flashes, most likely in perimenopause. Discussed definition of menopause  and on average, women will experience symptoms consistent with her experience for the 4-5 yrs preceding menopause. The main symptom is changes in the menstrual cycle, with other symptoms including hot flashes, mood changes and sleep problems. Discussed treatment options and at this time, patient feels her symptoms are manageable and she would like to avoid additional medications for as long as possible, which is reasonable. Encouraged her to reach out if/when any symptom(s) becomes disruptive to her lifestyle and she wishes to pursue further treatment. For now, she is grateful for the reassurance. Follow up as needed. Has annual scheduled with PCP in Jan '25.    Julieanne Manson, DO [facility]

## 2022-12-21 NOTE — Progress Notes (Signed)
[name] [address] [place] [address] Phone: [phone] Subjective:   [name], am serving as a scribe for Dr. Antoine Primas.  I'm seeing this patient by the request  of:  Lynn Curry, MD  CC: knee pain follow up   [id number]  10/13/2022 Patient does not feel like she has made any significant improvement at this time. Has been wearing the brace occasionally. Has started the vitamin supplementations. Discussed different things such as formal physical therapy or potential injections which patient declined. Discussed other potential injections including viscosupplementation as well as PRP. Discussed the possibility of certain imaging if needed. Follow-up with me again in 2 to 3 months.   Updated 12/25/2022 Lynn Krueger is a 47 y.o. female coming in with complaint of knee pain. Still has pain, but does use brace with activity. Does feel better when bracing. Overall better. Frequency has decreased and the pain has become more localized than feel as broad.       Past Medical History:  Diagnosis Date   Dysplastic nevus 06/20/2013   right mid back   Dysplastic nevus 06/20/2013   right lower back   No past surgical history on file. Social History   Socioeconomic History   Marital status: Married    Spouse name: Not on file   Number of children: Not on file   Years of education: Not on file   Highest education level: Not on file  Occupational History   Not on file  Tobacco Use   Smoking status: Unknown   Smokeless tobacco: Not on file  Substance and Sexual Activity   Alcohol use: Never   Drug use: Never   Sexual activity: Yes  Other Topics Concern   Not on file  Social History Narrative   Not on file   Social Determinants of Health   Financial Resource Strain: Low Risk  (02/10/2022)   Received from [facility], [facility]   Overall Financial Resource Strain (CARDIA)     Difficulty of Paying Living Expenses: Not hard at all  Food Insecurity: No Food Insecurity (02/10/2022)   Received from [facility], [facility]   Hunger Vital Sign    Worried About Running Out of Food in the Last Year: Never true    Ran Out of Food in the Last Year: Never true  Transportation Needs: No Transportation Needs (02/10/2022)   Received from [facility], [facility]   [facility] - Transportation    In the past 12 months, has lack of transportation kept you from medical appointments or from getting medications?: No    Lack of Transportation (Non-Medical): No  Physical Activity: Not on file  Stress: Not on file  Social Connections: Not on file   Allergies  Allergen Reactions   Azithromycin Other (See Comments)   Fluconazole Other (See Comments)    Other Reaction: GI   Barley Grass Other (See Comments)    Other Reaction: GI Upset   Glatiramer Other (See Comments)    Really bad post injection reaction.   Interferon Beta-1a Hives   Lactose Diarrhea   Other Other (See Comments)    Uncoded Allergy. Allergen: rye, Other Reaction: GI Upset   Penicillins Other (See Comments)    Other Reaction: Not Assessed   Tilactase Diarrhea   Family History  Problem Relation Age of Onset   Breast cancer Mother 62  bilateral cancer and double mastectomy         Current Outpatient Medications (Other):    carboxymethylcellulose (REFRESH PLUS) 0.5 % SOLN, Apply to eye.   Dimethyl Fumarate 240 MG CPDR, Take by mouth.   Multiple Vitamin (MULTIVITAMIN) capsule, Take 1 capsule by mouth daily.   Reviewed prior external information including notes and imaging from  primary care provider As well as notes that were available from care everywhere and other healthcare systems.  Past medical history, social, surgical and family history all reviewed in electronic medical record.  No pertanent information unless stated  regarding to the chief complaint.   Review of Systems:  No headache, visual changes, nausea, vomiting, diarrhea, constipation, dizziness, abdominal pain, skin rash, fevers, chills, night sweats, weight loss, swollen lymph nodes, body aches, joint swelling, chest pain, shortness of breath, mood changes. POSITIVE muscle aches  Objective  Blood pressure 98/62, pulse 90, height 5\' 6"  (1.676 m), weight 107 lb (48.5 kg), last menstrual period 11/11/2022, SpO2 98%.   General: No apparent distress alert and oriented x3 mood and affect normal, dressed appropriately.  HEENT: Pupils equal, extraocular movements intact  Respiratory: Patient's speak in full sentences and does not appear short of breath  Cardiovascular: No lower extremity edema, non tender, no erythema  Knee exam shows tenderness to palpation noted.  Still has very mild improvement in the VMO on the left side.  Patient still does not have significant muscle strength throughout her body.  Staying very active though it appears. No significant crepitus of the knee noted today.     Impression and Recommendations:    The above documentation has been reviewed and is accurate and complete Judi Saa, DO

## 2022-12-25 ENCOUNTER — Encounter: Payer: Self-pay | Admitting: Family Medicine

## 2022-12-25 ENCOUNTER — Ambulatory Visit: Payer: Managed Care, Other (non HMO) | Admitting: Family Medicine

## 2022-12-25 VITALS — BP 98/62 | HR 90 | Ht 66.0 in | Wt 107.0 lb

## 2022-12-25 DIAGNOSIS — M222X2 Patellofemoral disorders, left knee: Secondary | ICD-10-CM | POA: Diagnosis not present

## 2022-12-25 NOTE — Assessment & Plan Note (Signed)
Patient seems to be doing a little better but nothing that stopping her from activity at this moment.  Still gives her the comfort noted.

## 2022-12-25 NOTE — Patient Instructions (Addendum)
Keep working on [employer] and hip abductors Okay to add in squats to bench level Wear brace for next 6 weeks with exercise See you again in 10-12 weeks

## 2023-02-06 ENCOUNTER — Other Ambulatory Visit: Payer: Self-pay | Admitting: Medical Genetics

## 2023-02-06 ENCOUNTER — Other Ambulatory Visit: Payer: Self-pay | Admitting: Family Medicine

## 2023-02-06 DIAGNOSIS — Z1231 Encounter for screening mammogram for malignant neoplasm of breast: Secondary | ICD-10-CM

## 2023-03-02 NOTE — Progress Notes (Unsigned)
[name] [address] [place] [address] Phone: [phone] Subjective:   [name], am serving as a scribe for Dr. Antoine Primas.  I'm seeing this patient by the request  of:  Donnalee Curry, MD  CC: Left knee pain follow-up  [id number]  12/25/2022 Patient seems to be doing a little better but nothing that stopping her from activity at this moment.  Still gives her the comfort noted.     Updated 03/05/2023 JULYANNA SCHOLLE is a 48 y.o. female coming in with complaint of L knee pain. Patient states that she is doing better. Not painfree but having less pain. Notes the past 2 days pain in back of knee increased. Did step oddly off a curb this weekend which may have twisted knee. Went skiing and did not have pain but did wear brace.          Past Medical History:  Diagnosis Date   Dysplastic nevus 06/20/2013   right mid back   Dysplastic nevus 06/20/2013   right lower back   No past surgical history on file. Social History   Socioeconomic History   Marital status: Married    Spouse name: Not on file   Number of children: Not on file   Years of education: Not on file   Highest education level: Not on file  Occupational History   Not on file  Tobacco Use   Smoking status: Unknown   Smokeless tobacco: Not on file  Substance and Sexual Activity   Alcohol use: Never   Drug use: Never   Sexual activity: Yes  Other Topics Concern   Not on file  Social History Narrative   Not on file   Social Drivers of Health   Financial Resource Strain: Low Risk  (02/10/2022)   Received [facility], [facility]   Overall Financial Resource Strain (CARDIA)    Difficulty of Paying Living Expenses: Not hard at all  Food Insecurity: No Food Insecurity (02/10/2022)   Received from [facility], [facility]   Hunger Vital Sign    Worried About Running Out of Food  in the Last Year: Never true    Ran Out of Food in the Last Year: Never true  Transportation Needs: No Transportation Needs (02/10/2022)   Received from [facility], [facility]   [facility] - Transportation    In the past 12 months, has lack of transportation kept you from medical appointments or from getting medications?: No    Lack of Transportation (Non-Medical): No  Physical Activity: Not on file  Stress: Not on file  Social Connections: Not on file   Allergies  Allergen Reactions   Azithromycin Other (See Comments)   Fluconazole Other (See Comments)    Other Reaction: GI   Barley Grass Other (See Comments)    Other Reaction: GI Upset   Glatiramer Other (See Comments)    Really bad post injection reaction.   Interferon Beta-1a Hives   Lactose Diarrhea   Other Other (See Comments)    Uncoded Allergy. Allergen: rye, Other Reaction: GI Upset   Penicillins Other (See Comments)    Other Reaction: Not Assessed   Tilactase Diarrhea   Family History  Problem Relation Age of Onset   Breast cancer Mother 78       bilateral cancer and double mastectomy         Current Outpatient Medications (  Other):    carboxymethylcellulose (REFRESH PLUS) 0.5 % SOLN, Apply to eye.   Dimethyl Fumarate 240 MG CPDR, Take by mouth.   Multiple Vitamin (MULTIVITAMIN) capsule, Take 1 capsule by mouth daily.   Reviewed prior external information including notes and imaging from  primary care provider As well as notes that were available from care everywhere and other healthcare systems.  Past medical history, social, surgical and family history all reviewed in electronic medical record.  No pertanent information unless stated regarding to the chief complaint.   Review of Systems:  No headache, visual changes, nausea, vomiting, diarrhea, constipation, dizziness, abdominal pain, skin rash, fevers, chills, night sweats, weight loss, swollen lymph nodes, body aches,  joint swelling, chest pain, shortness of breath, mood changes. POSITIVE muscle aches  Objective  Blood pressure 110/70, pulse 70, height 5\' 6"  (1.676 m), weight 109 lb (49.4 kg), SpO2 99%.   General: No apparent distress alert and oriented x3 mood and affect normal, dressed appropriately.  HEENT: Pupils equal, extraocular movements intact  Respiratory: Patient's speak in full sentences and does not appear short of breath  Cardiovascular: No lower extremity edema, non tender, no erythema  Left knee does have some lateral tracking noted.  Mild crepitus noted. Patient does have a mild positive patellar grind test noted.  Good strength otherwise though.  Does have some mild atrophy of the VMO bilaterally.   Impression and Recommendations:     The above documentation has been reviewed and is accurate and complete Judi Saa, DO

## 2023-03-05 ENCOUNTER — Encounter: Payer: Self-pay | Admitting: Family Medicine

## 2023-03-05 ENCOUNTER — Ambulatory Visit: Payer: Managed Care, Other (non HMO) | Admitting: Family Medicine

## 2023-03-05 VITALS — BP 110/70 | HR 70 | Ht 66.0 in | Wt 109.0 lb

## 2023-03-05 DIAGNOSIS — M222X2 Patellofemoral disorders, left knee: Secondary | ICD-10-CM | POA: Diagnosis not present

## 2023-03-05 NOTE — Assessment & Plan Note (Signed)
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  Do encourage her to wear the brace on a more regular basis.  Worsening pain consider the possibility of injections.  Has difficulty gaining weight and we discussed protein supplementation.  Follow-up with me again in 6 to 8 weeks if needed

## 2023-03-05 NOTE — Patient Instructions (Signed)
See you again in 3 months Work on VMO and hip abductors Limit ROM

## 2023-03-16 ENCOUNTER — Ambulatory Visit
Admission: RE | Admit: 2023-03-16 | Discharge: 2023-03-16 | Disposition: A | Discharge status: Home / Self Care | Payer: Managed Care, Other (non HMO) | Source: Ambulatory Visit | Attending: Family Medicine | Admitting: Family Medicine

## 2023-03-16 DIAGNOSIS — Z1231 Encounter for screening mammogram for malignant neoplasm of breast: Secondary | ICD-10-CM | POA: Diagnosis present

## 2023-03-17 ENCOUNTER — Other Ambulatory Visit
Admission: RE | Admit: 2023-03-17 | Discharge: 2023-03-17 | Disposition: A | Discharge status: Home / Self Care | Payer: Self-pay | Source: Ambulatory Visit | Attending: Medical Genetics | Admitting: Medical Genetics

## 2023-03-30 LAB — GENECONNECT MOLECULAR SCREEN: Genetic Analysis Overall Interpretation: NEGATIVE

## 2023-04-19 ENCOUNTER — Ambulatory Visit: Payer: Managed Care, Other (non HMO) | Admitting: Dermatology

## 2023-04-23 ENCOUNTER — Ambulatory Visit: Admitting: Dermatology

## 2023-04-23 ENCOUNTER — Encounter: Payer: Self-pay | Admitting: Dermatology

## 2023-04-23 DIAGNOSIS — D225 Melanocytic nevi of trunk: Secondary | ICD-10-CM

## 2023-04-23 DIAGNOSIS — W908XXA Exposure to other nonionizing radiation, initial encounter: Secondary | ICD-10-CM

## 2023-04-23 DIAGNOSIS — D229 Melanocytic nevi, unspecified: Secondary | ICD-10-CM

## 2023-04-23 DIAGNOSIS — Z1283 Encounter for screening for malignant neoplasm of skin: Secondary | ICD-10-CM | POA: Diagnosis not present

## 2023-04-23 DIAGNOSIS — L739 Follicular disorder, unspecified: Secondary | ICD-10-CM

## 2023-04-23 DIAGNOSIS — L578 Other skin changes due to chronic exposure to nonionizing radiation: Secondary | ICD-10-CM | POA: Diagnosis not present

## 2023-04-23 DIAGNOSIS — Z86018 Personal history of other benign neoplasm: Secondary | ICD-10-CM

## 2023-04-23 DIAGNOSIS — L814 Other melanin hyperpigmentation: Secondary | ICD-10-CM | POA: Diagnosis not present

## 2023-04-23 DIAGNOSIS — L905 Scar conditions and fibrosis of skin: Secondary | ICD-10-CM

## 2023-04-23 DIAGNOSIS — L821 Other seborrheic keratosis: Secondary | ICD-10-CM

## 2023-04-23 NOTE — Patient Instructions (Signed)

## 2023-04-23 NOTE — Progress Notes (Signed)
 Follow-Up Visit   Subjective  Lynn Krueger is a 48 y.o. female who presents for the following: Skin Cancer Screening and Full Body Skin Exam  The patient presents for Total-Body Skin Exam (TBSE) for skin cancer screening and mole check. The patient has spots, moles and lesions to be evaluated, some may be new or changing and the patient may have concern these could be cancer.  The following portions of the chart were reviewed this encounter and updated as appropriate: medications, allergies, medical history  Review of Systems:  No other skin or systemic complaints except as noted in HPI or Assessment and Plan.  Objective  Well appearing patient in no apparent distress; mood and affect are within normal limits.  A full examination was performed including scalp, head, eyes, ears, nose, lips, neck, chest, axillae, abdomen, back, buttocks, bilateral upper extremities, bilateral lower extremities, hands, feet, fingers, toes, fingernails, and toenails. All findings within normal limits unless otherwise noted below.   Relevant physical exam findings are noted in the Assessment and Plan.    Assessment & Plan   SKIN CANCER SCREENING PERFORMED TODAY.  ACTINIC DAMAGE - Chronic condition, secondary to cumulative UV/sun exposure - diffuse scaly erythematous macules with underlying dyspigmentation - Recommend daily broad spectrum sunscreen SPF 30+ to sun-exposed areas, reapply every 2 hours as needed.  - Staying in the shade or wearing long sleeves, sun glasses (UVA+UVB protection) and wide brim hats (4-inch brim around the entire circumference of the hat) are also recommended for sun protection.  - Call for new or changing lesions.  LENTIGINES, SEBORRHEIC KERATOSES, HEMANGIOMAS - Benign normal skin lesions - Benign-appearing - Call for any changes  MELANOCYTIC NEVI - Tan-brown and/or pink-flesh-colored symmetric macules and papules - Benign appearing on exam today - Observation -  Call clinic for new or changing moles - Recommend daily use of broad spectrum spf 30+ sunscreen to sun-exposed areas.   HISTORY OF DYSPLASTIC NEVI No evidence of recurrence today Recommend regular full body skin exams Recommend daily broad spectrum sunscreen SPF 30+ to sun-exposed areas, reapply every 2 hours as needed.  Call if any new or changing lesions are noted between office visits  SCAR - R ant wrist Exam: Dyspigmented smooth macule or patch. Benign-appearing.  Observation.  Call clinic for new or changing lesions. Recommend daily broad spectrum sunscreen SPF 30+, reapply every 2 hours as needed. Treatment: Recommend Serica moisturizing scar formula cream every night or [facility] brand or Mederma silicone scar sheet every night for the first year after a scar appears to help with scar remodeling if desired. Scars remodel on their own for a full year and will gradually improve in appearance over time.  MELANOCYTIC NEVUS - bx proven benign of the RUQA lat at the side Exam: Re-pigmented brown macule.  Treatment Plan: Benign appearing on exam today. Recommend observation. Call clinic for new or changing moles. Recommend daily use of broad spectrum spf 30+ sunscreen to sun-exposed areas.    FOLLICULITIS Exam: Perifollicular erythematous papules and pustules of the buttocks  Folliculitis occurs due to inflammation of the superficial hair follicle (pore), resulting in acne-like lesions (pus bumps). It can be infectious (bacterial, fungal) or noninfectious (shaving, tight clothing, heat/sweat, medications).  Folliculitis can be acute or chronic and recommended treatment depends on the underlying cause of folliculitis.  Treatment Plan: Recommend OTC Cln lotion daily.  Return in about 1 year (around 04/22/2024) for TBSE.  Maylene Roes, CMA, am acting as scribe for Armida Sans, MD .  Documentation: I have reviewed the above documentation for accuracy and completeness, and I agree with  the above.  Armida Sans, MD

## 2023-06-01 NOTE — Progress Notes (Signed)
 [name] [address] [place] [address] Phone: [phone] Subjective:   [name], am serving as a scribe for Dr. Ronnell Coins.  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 Do encourage her to wear the brace on a more regular basis. Worsening pain consider the possibility of injections. Has difficulty gaining weight and we discussed protein supplementation. Follow-up with me again in 6 to 8 weeks if needed   Updated 06/05/2023 Lynn Krueger is a 48 y.o. female coming in with complaint of L knee pain. Pain is not consistent, but its not 100% gone. No new symptoms.      Past Medical History:  Diagnosis Date   Dysplastic nevus 06/20/2013   right mid back   Dysplastic nevus 06/20/2013   right lower back   No past surgical history on file. Social History   Socioeconomic History   Marital status: Married    Spouse name: Not on file   Number of children: Not on file   Years of education: Not on file   Highest education level: Not on file  Occupational History   Not on file  Tobacco Use   Smoking status: Unknown   Smokeless tobacco: Not on file  Substance and Sexual Activity   Alcohol use: Never   Drug use: Never   Sexual activity: Yes  Other Topics Concern   Not on file  Social History Narrative   Not on file   Social Drivers of Health   Financial Resource Strain: Low Risk  (06/04/2023)   Received from [facility]   Overall Financial Resource Strain (CARDIA)    Difficulty of Paying Living Expenses: Not hard at all  Food Insecurity: No Food Insecurity (06/04/2023)   Received from [facility]   Hunger Vital Sign    Worried About Running Out of Food in the Last Year: Never true    Ran Out of Food in the Last Year: Never true   Transportation Needs: No Transportation Needs (06/04/2023)   Received from [facility] - [facility]    In the past 12 months, has lack of transportation kept you from medical appointments or from getting medications?: No    Lack of Transportation (Non-Medical): No  Physical Activity: Not on file  Stress: Not on file  Social Connections: Not on file   Allergies  Allergen Reactions   Azithromycin Other (See Comments)   Fluconazole Other (See Comments)    Other Reaction: GI   Barley Grass Other (See Comments)    Other Reaction: GI Upset   Glatiramer Other (See Comments)    Really bad post injection reaction.   Interferon Beta-1a Hives   Lactose Diarrhea   Other Other (See Comments)    Uncoded Allergy. Allergen: rye, Other Reaction: GI Upset   Penicillins Other (See Comments)    Other Reaction: Not Assessed   Tilactase Diarrhea   Family History  Problem Relation Age of Onset   Breast cancer Mother 25       bilateral cancer and double mastectomy         Current Outpatient Medications (Other):    carboxymethylcellulose (REFRESH PLUS) 0.5 % SOLN, Apply to eye.   Dimethyl Fumarate 240 MG CPDR, Take by mouth.   Multiple Vitamin (MULTIVITAMIN) capsule,  Take 1 capsule by mouth daily.   Reviewed prior external information including notes and imaging from  primary care provider As well as notes that were available from care everywhere and other healthcare systems.  Past medical history, social, surgical and family history all reviewed in electronic medical record.  No pertanent information unless stated regarding to the chief complaint.   Review of Systems:  No headache, visual changes, nausea, vomiting, diarrhea, constipation, dizziness, abdominal pain, skin rash, fevers, chills, night sweats, weight loss, swollen lymph nodes, body aches, joint swelling, chest pain, shortness of breath, mood changes. POSITIVE muscle aches  Objective  Blood  pressure 96/64, pulse 84, height 5\' 6"  (1.676 m), weight 109 lb (49.4 kg), SpO2 97%.   General: No apparent distress alert and oriented x3 mood and affect normal, dressed appropriately.  HEENT: Pupils equal, extraocular movements intact  Respiratory: Patient's speak in full sentences and does not appear short of breath  Cardiovascular: No lower extremity edema, non tender, no erythema Left knee does have some lateral tracking noted.  Medial plica noted.     Impression and Recommendations:    The above documentation has been reviewed and is accurate and complete Lynn Krueger M Ronnell Clinger, DO

## 2023-06-05 ENCOUNTER — Ambulatory Visit: Payer: Managed Care, Other (non HMO) | Admitting: Family Medicine

## 2023-06-05 VITALS — BP 96/64 | HR 84 | Ht 66.0 in | Wt 109.0 lb

## 2023-06-05 DIAGNOSIS — M222X2 Patellofemoral disorders, left knee: Secondary | ICD-10-CM | POA: Diagnosis not present

## 2023-06-05 NOTE — Assessment & Plan Note (Addendum)
 Overall still has some different difficulty with the tracking still of the knee.  Discussed icing regimen and home exercises, discussed which activities to do and which ones to avoid.  Discussed increasing activity, icing regimen.  Follow-up again in 3 months.  Worsening pain do need to consider the possibility of injections.

## 2023-06-05 NOTE — Patient Instructions (Signed)
 Good to see you! Ice the knees after work and wear brace Work on strengthening See you again in 2-3 months

## 2023-07-30 NOTE — Progress Notes (Signed)
 Lynn Krueger [facility] [address] [place] [address] Phone: [phone] Subjective:   Lynn Krueger, am serving as a scribe for Dr. Arthea Krueger.  I'm seeing this patient by the request  of:  Lynn Glenys CROME, MD  CC: left knee pain   YEP:[id number]  06/05/2023 Overall still has some different difficulty with the tracking still of the knee. Discussed icing regimen and home exercises, discussed which activities to do and which ones to avoid. Discussed increasing activity, icing regimen. Follow-up again in 3 months. Worsening pain do need to consider the possibility of injections.   Updated 08/06/2023 Lynn Krueger is a 48 y.o. female coming in with complaint of L knee pain. Patient states        Past Medical History:  Diagnosis Date   Dysplastic nevus 06/20/2013   right mid back   Dysplastic nevus 06/20/2013   right lower back   No past surgical history on file. Social History   Socioeconomic History   Marital status: Married    Spouse name: Not on file   Number of children: Not on file   Years of education: Not on file   Highest education level: Not on file  Occupational History   Not on file  Tobacco Use   Smoking status: Unknown   Smokeless tobacco: Not on file  Substance and Sexual Activity   Alcohol use: Never   Drug use: Never   Sexual activity: Yes  Other Topics Concern   Not on file  Social History Narrative   Not on file   Social Drivers of Health   Financial Resource Strain: Low Risk  (06/04/2023)   Received from [facility]   Overall Financial Resource Strain (CARDIA)    Difficulty of Paying Living Expenses: Not hard at all  Food Insecurity: No Food Insecurity (06/04/2023)   Received from [facility]   Hunger Vital Sign    Within the past 12 months, you worried that your food would run out before you got the money to buy more.: Never true    Within the past 12 months, the food you  bought just didn't last and you didn't have money to get more.: Never true  Transportation Needs: No Transportation Needs (06/04/2023)   Received from [facility]    In the past 12 months, has lack of transportation kept you from medical appointments or from getting medications?: No    Lack of Transportation (Non-Medical): No  Physical Activity: Not on file  Stress: Not on file  Social Connections: Not on file   Allergies  Allergen Reactions   Azithromycin Other (See Comments)   Fluconazole Other (See Comments)    Other Reaction: GI   Barley Grass Other (See Comments)    Other Reaction: GI Upset   Glatiramer Other (See Comments)    Really bad post injection reaction.   Interferon Beta-1a Hives   Lactose Diarrhea   Other Other (See Comments)    Uncoded Allergy. Allergen: rye, Other Reaction: GI Upset   Penicillins Other (See Comments)    Other Reaction: Not Assessed   Tilactase Diarrhea   Family History  Problem Relation Age of Onset   Breast cancer Mother 41       bilateral cancer and double mastectomy         Current Outpatient Medications (Other):    carboxymethylcellulose (REFRESH PLUS) 0.5 % SOLN, Apply to eye.  Dimethyl Fumarate 240 MG CPDR, Take by mouth.   Multiple Vitamin (MULTIVITAMIN) capsule, Take 1 capsule by mouth daily.   Reviewed prior external information including notes and imaging from  primary care provider As well as notes that were available from care everywhere and other healthcare systems.  Past medical history, social, surgical and family history all reviewed in electronic medical record.  No pertanent information unless stated regarding to the chief complaint.   Review of Systems:  No headache, visual changes, nausea, vomiting, diarrhea, constipation, dizziness, abdominal pain, skin rash, fevers, chills, night sweats, weight loss, swollen lymph nodes, body aches, joint swelling, chest pain,  shortness of breath, mood changes. POSITIVE muscle aches  Objective  Blood pressure 102/62, pulse 98, height 5' 6 (1.676 m), weight 104 lb (47.2 kg), SpO2 97%.   General: No apparent distress alert and oriented x3 mood and affect normal, dressed appropriately.  HEENT: Pupils equal, extraocular movements intact  Respiratory: Patient's speak in full sentences and does not appear short of breath  Cardiovascular: No lower extremity edema, non tender, no erythema  Left knee pain exam shows no significant swelling at this time.  Full range of motion noted.  Still some atrophy noted in the VMO with mild lateral tracking of the patella     Impression and Recommendations:    The above documentation has been reviewed and is accurate and complete Lynn Krueger M Lynn Davisson, DO

## 2023-08-06 ENCOUNTER — Encounter: Payer: Self-pay | Admitting: Family Medicine

## 2023-08-06 ENCOUNTER — Ambulatory Visit: Admitting: Family Medicine

## 2023-08-06 VITALS — BP 102/62 | HR 98 | Ht 66.0 in | Wt 104.0 lb

## 2023-08-06 DIAGNOSIS — M222X2 Patellofemoral disorders, left knee: Secondary | ICD-10-CM

## 2023-08-06 NOTE — Assessment & Plan Note (Signed)
 Much improved with the conservative therapy.  No significant changes.  Follow-up again in 3 to 6 months

## 2023-08-06 NOTE — Patient Instructions (Signed)
 QL stretches Keep to pointed forward with walking See me again in 3 months

## 2023-08-24 IMAGING — MG MM DIGITAL SCREENING BILAT W/ TOMO AND CAD
8 series · 9 of 24 positions shown · non-contrast
Comparison: Previous exam(s).

CLINICAL DATA: Screening.

EXAM:
DIGITAL SCREENING BILATERAL MAMMOGRAM WITH TOMOSYNTHESIS AND CAD
TECHNIQUE: Bilateral screening digital craniocaudal and mediolateral oblique
mammograms were obtained. Bilateral screening digital breast
tomosynthesis was performed. The images were evaluated with
computer-aided detection.

[L CC synth-2D]
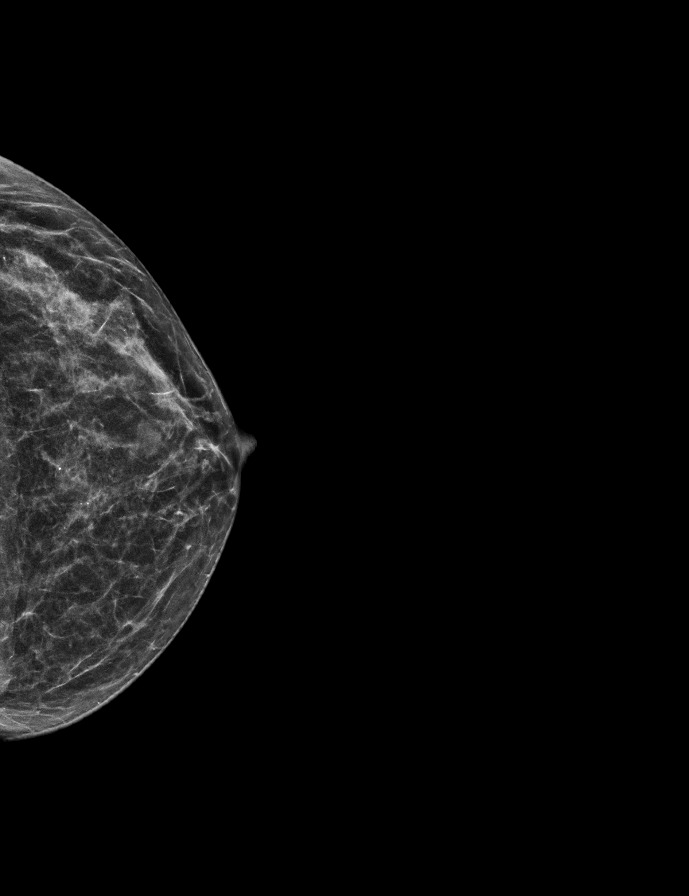

[L MLO synth-2D]
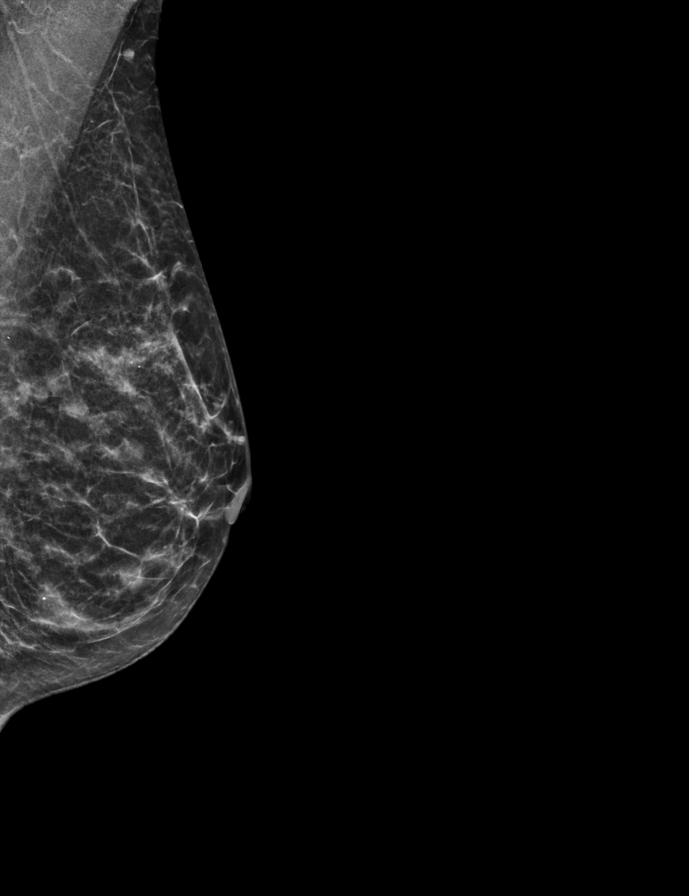

[R CC synth-2D]
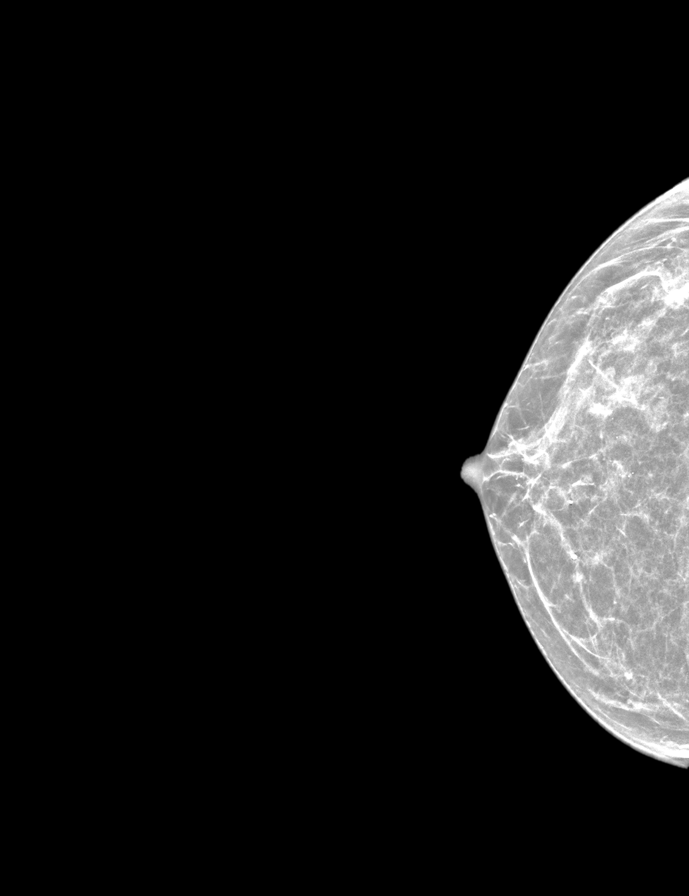

[R MLO synth-2D]
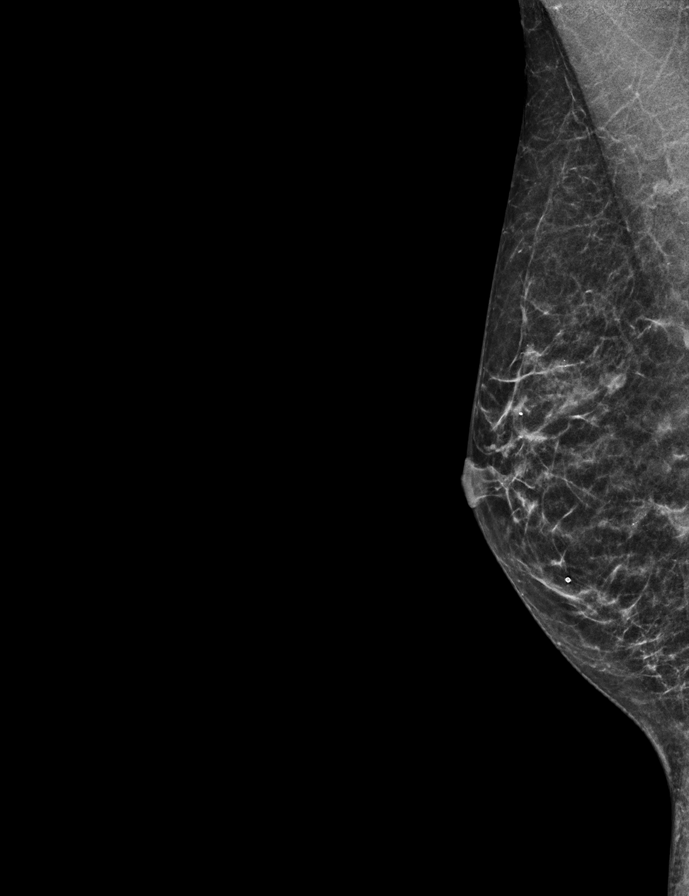

[R CC tomo · 2 of 42 frames shown]
[frame 14/42]
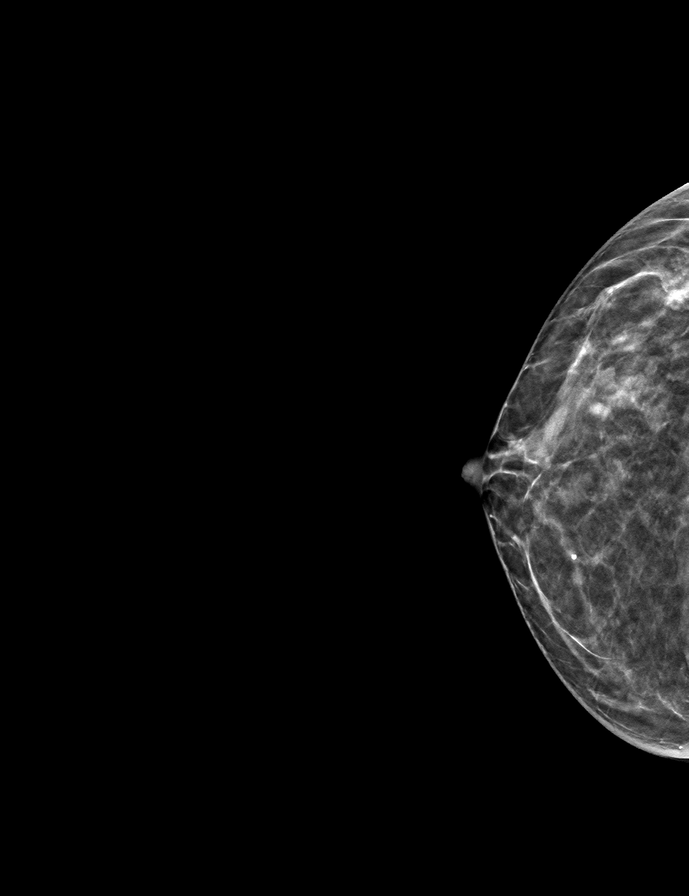
[frame 21/42]
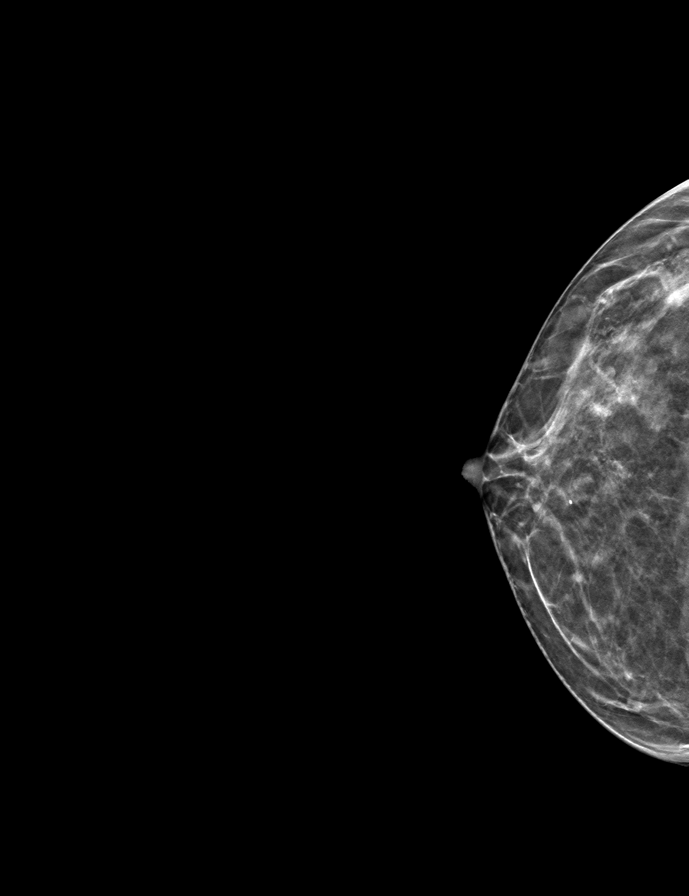

[L CC tomo · tomo slice 23/45.0]
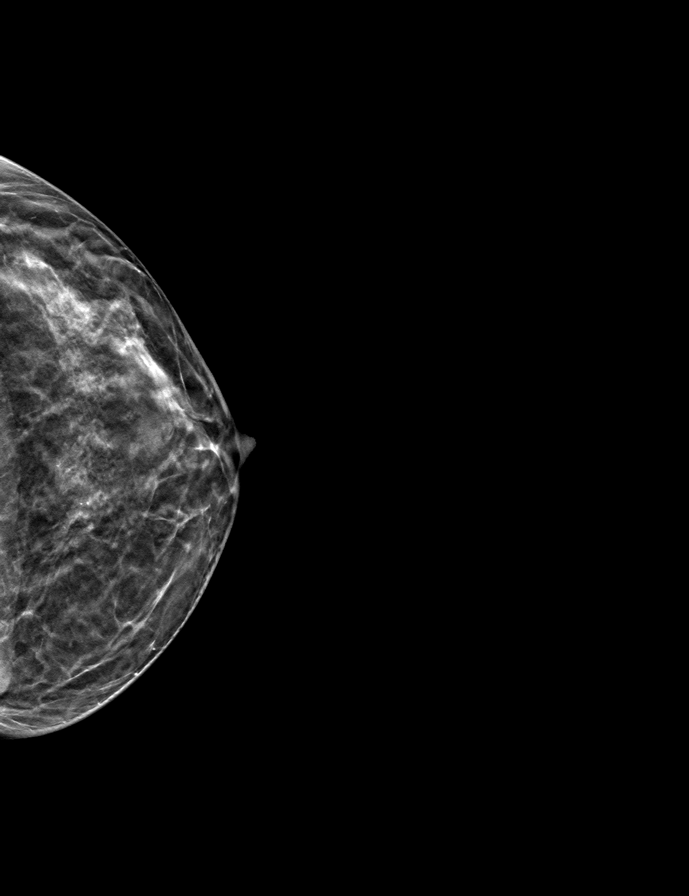

[L MLO tomo · tomo slice 22/43.0]
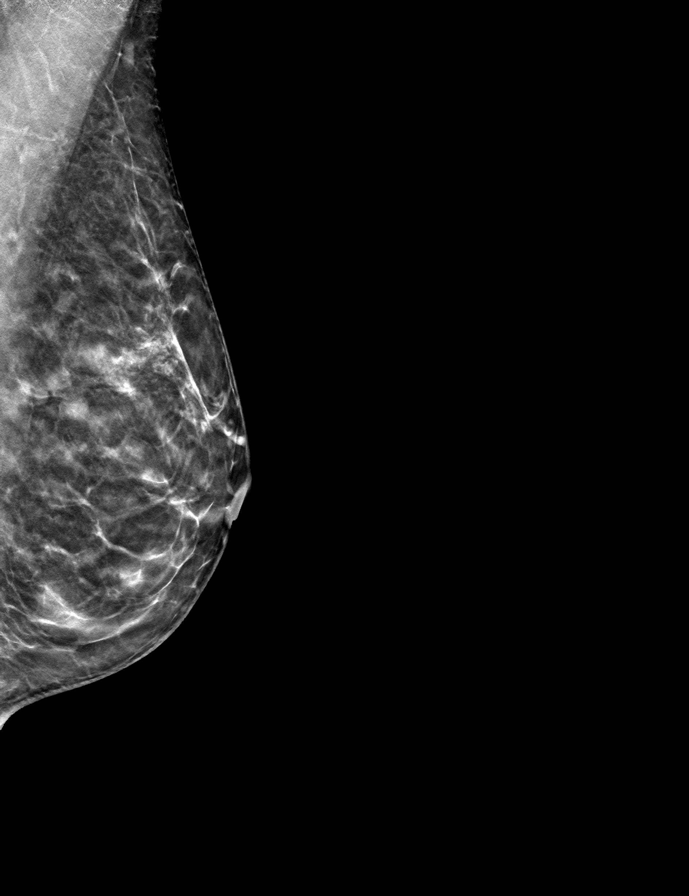

[R MLO tomo · tomo slice 21/40.0]
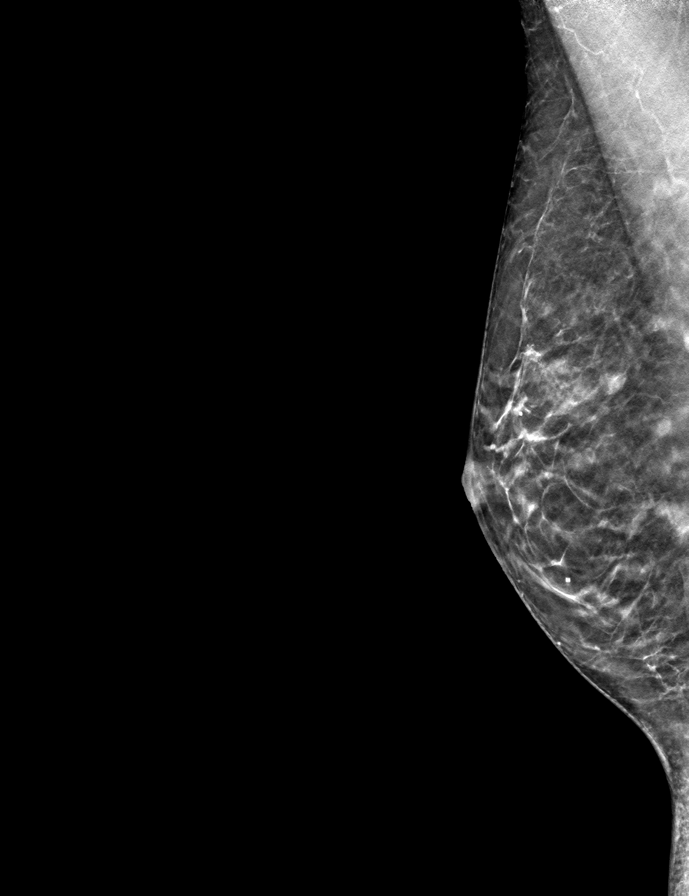

[9 of 24 positions shown; findings below may reference images not displayed]

ACR Breast Density Category c: The breast tissue is heterogeneously
dense, which may obscure small masses.
FINDINGS: There are no findings suspicious for malignancy.
IMPRESSION: No mammographic evidence of malignancy. A result letter of this
screening mammogram will be mailed directly to the patient.

RECOMMENDATION:
Screening mammogram in one year. (Code:Q3-W-BC3)

BI-RADS CATEGORY  1: Negative.

## 2023-11-06 ENCOUNTER — Ambulatory Visit: Admitting: Family Medicine

## 2024-02-18 ENCOUNTER — Other Ambulatory Visit: Payer: Self-pay | Admitting: Family Medicine

## 2024-02-18 DIAGNOSIS — Z1231 Encounter for screening mammogram for malignant neoplasm of breast: Secondary | ICD-10-CM

## 2024-03-17 ENCOUNTER — Encounter

## 2024-04-23 ENCOUNTER — Ambulatory Visit: Admitting: Dermatology
